# Patient Record
Sex: Male | Born: 1965 | Race: White | Hispanic: No | Marital: Married | Smoking: Never smoker
Health system: Southern US, Community
[De-identification: ages and names within clinical notes are randomized; demographics above are authoritative.]

## PROBLEM LIST (undated history)

## (undated) DIAGNOSIS — J189 Pneumonia, unspecified organism: Secondary | ICD-10-CM

## (undated) DIAGNOSIS — H8109 Meniere's disease, unspecified ear: Secondary | ICD-10-CM

## (undated) DIAGNOSIS — I10 Essential (primary) hypertension: Secondary | ICD-10-CM

## (undated) DIAGNOSIS — F419 Anxiety disorder, unspecified: Secondary | ICD-10-CM

## (undated) DIAGNOSIS — J45909 Unspecified asthma, uncomplicated: Secondary | ICD-10-CM

## (undated) DIAGNOSIS — H9190 Unspecified hearing loss, unspecified ear: Secondary | ICD-10-CM

## (undated) DIAGNOSIS — K5792 Diverticulitis of intestine, part unspecified, without perforation or abscess without bleeding: Secondary | ICD-10-CM

## (undated) HISTORY — PX: HERNIA REPAIR: SHX51

## (undated) HISTORY — PX: APPENDECTOMY: SHX54

---
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
## 1998-12-26 HISTORY — PX: COLECTOMY: SHX59

## 2012-10-04 ENCOUNTER — Ambulatory Visit (HOSPITAL_BASED_OUTPATIENT_CLINIC_OR_DEPARTMENT_OTHER)
Admission: RE | Admit: 2012-10-04 | Discharge: 2012-10-04 | Disposition: A | Discharge status: Home / Self Care | Payer: BC Managed Care – PPO | Source: Ambulatory Visit | Attending: Nurse Practitioner | Admitting: Nurse Practitioner

## 2012-10-04 ENCOUNTER — Other Ambulatory Visit (HOSPITAL_BASED_OUTPATIENT_CLINIC_OR_DEPARTMENT_OTHER): Payer: Self-pay | Admitting: Nurse Practitioner

## 2012-10-04 ENCOUNTER — Encounter (HOSPITAL_BASED_OUTPATIENT_CLINIC_OR_DEPARTMENT_OTHER): Payer: Self-pay

## 2012-10-04 DIAGNOSIS — K5730 Diverticulosis of large intestine without perforation or abscess without bleeding: Secondary | ICD-10-CM | POA: Insufficient documentation

## 2012-10-04 DIAGNOSIS — R109 Unspecified abdominal pain: Secondary | ICD-10-CM | POA: Insufficient documentation

## 2012-10-04 DIAGNOSIS — R911 Solitary pulmonary nodule: Secondary | ICD-10-CM | POA: Insufficient documentation

## 2012-10-04 MED ORDER — IOHEXOL 300 MG/ML  SOLN
100.0000 mL | Freq: Once | INTRAMUSCULAR | Status: AC | PRN
  Administered 2012-10-04: 100 mL via INTRAVENOUS

## 2019-02-23 ENCOUNTER — Inpatient Hospital Stay (HOSPITAL_COMMUNITY)
Admission: EM | Admit: 2019-02-23 | Discharge: 2019-02-28 | DRG: 329 | Disposition: A | Discharge status: Home Health | Payer: BC Managed Care – PPO | Attending: Internal Medicine | Admitting: Internal Medicine

## 2019-02-23 ENCOUNTER — Inpatient Hospital Stay (HOSPITAL_COMMUNITY): Payer: BC Managed Care – PPO

## 2019-02-23 ENCOUNTER — Emergency Department (HOSPITAL_COMMUNITY): Payer: BC Managed Care – PPO

## 2019-02-23 ENCOUNTER — Other Ambulatory Visit: Payer: Self-pay

## 2019-02-23 ENCOUNTER — Encounter (HOSPITAL_COMMUNITY): Payer: Self-pay | Admitting: Emergency Medicine

## 2019-02-23 DIAGNOSIS — K56699 Other intestinal obstruction unspecified as to partial versus complete obstruction: Secondary | ICD-10-CM | POA: Diagnosis present

## 2019-02-23 DIAGNOSIS — I10 Essential (primary) hypertension: Secondary | ICD-10-CM | POA: Diagnosis present

## 2019-02-23 DIAGNOSIS — K5732 Diverticulitis of large intestine without perforation or abscess without bleeding: Secondary | ICD-10-CM | POA: Diagnosis present

## 2019-02-23 DIAGNOSIS — J9601 Acute respiratory failure with hypoxia: Secondary | ICD-10-CM | POA: Diagnosis not present

## 2019-02-23 DIAGNOSIS — D72829 Elevated white blood cell count, unspecified: Secondary | ICD-10-CM | POA: Diagnosis not present

## 2019-02-23 DIAGNOSIS — Z9049 Acquired absence of other specified parts of digestive tract: Secondary | ICD-10-CM

## 2019-02-23 DIAGNOSIS — B9729 Other coronavirus as the cause of diseases classified elsewhere: Secondary | ICD-10-CM | POA: Diagnosis not present

## 2019-02-23 DIAGNOSIS — E8770 Fluid overload, unspecified: Secondary | ICD-10-CM | POA: Diagnosis present

## 2019-02-23 DIAGNOSIS — R066 Hiccough: Secondary | ICD-10-CM | POA: Diagnosis present

## 2019-02-23 DIAGNOSIS — E860 Dehydration: Secondary | ICD-10-CM | POA: Diagnosis present

## 2019-02-23 DIAGNOSIS — Z23 Encounter for immunization: Secondary | ICD-10-CM | POA: Diagnosis not present

## 2019-02-23 DIAGNOSIS — J189 Pneumonia, unspecified organism: Secondary | ICD-10-CM | POA: Diagnosis not present

## 2019-02-23 DIAGNOSIS — Z79899 Other long term (current) drug therapy: Secondary | ICD-10-CM

## 2019-02-23 DIAGNOSIS — J45909 Unspecified asthma, uncomplicated: Secondary | ICD-10-CM | POA: Diagnosis present

## 2019-02-23 DIAGNOSIS — H8101 Meniere's disease, right ear: Secondary | ICD-10-CM | POA: Diagnosis present

## 2019-02-23 DIAGNOSIS — Z87892 Personal history of anaphylaxis: Secondary | ICD-10-CM | POA: Diagnosis not present

## 2019-02-23 DIAGNOSIS — Z91013 Allergy to seafood: Secondary | ICD-10-CM

## 2019-02-23 DIAGNOSIS — Z8719 Personal history of other diseases of the digestive system: Secondary | ICD-10-CM | POA: Diagnosis not present

## 2019-02-23 DIAGNOSIS — K5730 Diverticulosis of large intestine without perforation or abscess without bleeding: Secondary | ICD-10-CM | POA: Diagnosis present

## 2019-02-23 DIAGNOSIS — E440 Moderate protein-calorie malnutrition: Secondary | ICD-10-CM | POA: Diagnosis present

## 2019-02-23 DIAGNOSIS — R609 Edema, unspecified: Secondary | ICD-10-CM

## 2019-02-23 DIAGNOSIS — R739 Hyperglycemia, unspecified: Secondary | ICD-10-CM | POA: Diagnosis present

## 2019-02-23 DIAGNOSIS — Z4659 Encounter for fitting and adjustment of other gastrointestinal appliance and device: Secondary | ICD-10-CM

## 2019-02-23 DIAGNOSIS — N5089 Other specified disorders of the male genital organs: Secondary | ICD-10-CM | POA: Diagnosis not present

## 2019-02-23 DIAGNOSIS — Y95 Nosocomial condition: Secondary | ICD-10-CM | POA: Diagnosis not present

## 2019-02-23 DIAGNOSIS — E739 Lactose intolerance, unspecified: Secondary | ICD-10-CM | POA: Diagnosis present

## 2019-02-23 DIAGNOSIS — F419 Anxiety disorder, unspecified: Secondary | ICD-10-CM | POA: Diagnosis present

## 2019-02-23 DIAGNOSIS — K56609 Unspecified intestinal obstruction, unspecified as to partial versus complete obstruction: Secondary | ICD-10-CM | POA: Diagnosis present

## 2019-02-23 HISTORY — DX: Anxiety disorder, unspecified: F41.9

## 2019-02-23 HISTORY — DX: Meniere's disease, unspecified ear: H81.09

## 2019-02-23 HISTORY — DX: Diverticulitis of intestine, part unspecified, without perforation or abscess without bleeding: K57.92

## 2019-02-23 LAB — CBC
HCT: 49.7 % (ref 39.0–52.0)
Hemoglobin: 16.7 g/dL (ref 13.0–17.0)
MCH: 29.8 pg (ref 26.0–34.0)
MCHC: 33.6 g/dL (ref 30.0–36.0)
MCV: 88.6 fL (ref 80.0–100.0)
PLATELETS: 302 10*3/uL (ref 150–400)
RBC: 5.61 MIL/uL (ref 4.22–5.81)
RDW: 12.4 % (ref 11.5–15.5)
WBC: 16.8 10*3/uL — ABNORMAL HIGH (ref 4.0–10.5)
nRBC: 0 % (ref 0.0–0.2)

## 2019-02-23 LAB — URINALYSIS, ROUTINE W REFLEX MICROSCOPIC
Bilirubin Urine: NEGATIVE
Glucose, UA: NEGATIVE mg/dL
Hgb urine dipstick: NEGATIVE
Ketones, ur: 80 mg/dL — AB
Nitrite: NEGATIVE
PH: 5 (ref 5.0–8.0)
Protein, ur: 30 mg/dL — AB
Specific Gravity, Urine: 1.024 (ref 1.005–1.030)

## 2019-02-23 LAB — COMPREHENSIVE METABOLIC PANEL
ALT: 18 U/L (ref 0–44)
AST: 21 U/L (ref 15–41)
Albumin: 4.3 g/dL (ref 3.5–5.0)
Alkaline Phosphatase: 61 U/L (ref 38–126)
Anion gap: 12 (ref 5–15)
BUN: 10 mg/dL (ref 6–20)
CHLORIDE: 107 mmol/L (ref 98–111)
CO2: 18 mmol/L — ABNORMAL LOW (ref 22–32)
Calcium: 9.2 mg/dL (ref 8.9–10.3)
Creatinine, Ser: 0.91 mg/dL (ref 0.61–1.24)
GFR calc Af Amer: >60 mL/min (ref >60)
GFR calc non Af Amer: >60 mL/min (ref >60)
Glucose, Bld: 154 mg/dL — ABNORMAL HIGH (ref 70–99)
Potassium: 3.1 mmol/L — ABNORMAL LOW (ref 3.5–5.1)
Sodium: 137 mmol/L (ref 135–145)
Total Bilirubin: 1.3 mg/dL — ABNORMAL HIGH (ref 0.3–1.2)
Total Protein: 7.2 g/dL (ref 6.5–8.1)

## 2019-02-23 LAB — LIPASE, BLOOD: Lipase: 34 U/L (ref 11–51)

## 2019-02-23 LAB — MAGNESIUM: Magnesium: 2.3 mg/dL (ref 1.7–2.4)

## 2019-02-23 MED ORDER — FAMOTIDINE IN NACL 20-0.9 MG/50ML-% IV SOLN
20.0000 mg | INTRAVENOUS | Status: AC
  Administered 2019-02-23: 20 mg via INTRAVENOUS
  Filled 2019-02-23: qty 50

## 2019-02-23 MED ORDER — LORAZEPAM 2 MG/ML IJ SOLN: 1.0000 mg | Freq: Once | INTRAMUSCULAR | Status: DC

## 2019-02-23 MED ORDER — SODIUM CHLORIDE 0.9 % IV SOLN
INTRAVENOUS | Status: DC | PRN
  Administered 2019-02-23: 250 mL via INTRAVENOUS

## 2019-02-23 MED ORDER — ACETAMINOPHEN 650 MG RE SUPP: 650.0000 mg | Freq: Four times a day (QID) | RECTAL | Status: DC | PRN

## 2019-02-23 MED ORDER — SODIUM CHLORIDE 0.9% FLUSH
3.0000 mL | Freq: Once | INTRAVENOUS | Status: AC
  Administered 2019-02-23: 3 mL via INTRAVENOUS

## 2019-02-23 MED ORDER — ONDANSETRON HCL 4 MG PO TABS: 4.0000 mg | ORAL_TABLET | Freq: Four times a day (QID) | ORAL | Status: DC | PRN

## 2019-02-23 MED ORDER — POTASSIUM CHLORIDE IN NACL 40-0.9 MEQ/L-% IV SOLN
INTRAVENOUS | Status: DC
  Administered 2019-02-23 – 2019-02-25 (×6): 125 mL/h via INTRAVENOUS
  Filled 2019-02-23 (×7): qty 1000

## 2019-02-23 MED ORDER — PROMETHAZINE HCL 25 MG/ML IJ SOLN: 12.5000 mg | Freq: Four times a day (QID) | INTRAMUSCULAR | Status: DC | PRN

## 2019-02-23 MED ORDER — PIPERACILLIN-TAZOBACTAM 3.375 G IVPB
3.3750 g | Freq: Three times a day (TID) | INTRAVENOUS | Status: DC
  Administered 2019-02-23 – 2019-02-28 (×16): 3.375 g via INTRAVENOUS
  Filled 2019-02-23 (×16): qty 50

## 2019-02-23 MED ORDER — HYDROMORPHONE HCL 1 MG/ML IJ SOLN
0.5000 mg | Freq: Once | INTRAMUSCULAR | Status: AC | PRN
  Administered 2019-02-23: 0.5 mg via INTRAVENOUS
  Filled 2019-02-23: qty 1

## 2019-02-23 MED ORDER — ACETAMINOPHEN 325 MG PO TABS: 650.0000 mg | ORAL_TABLET | Freq: Four times a day (QID) | ORAL | Status: DC | PRN

## 2019-02-23 MED ORDER — ONDANSETRON HCL 4 MG/2ML IJ SOLN
4.0000 mg | Freq: Once | INTRAMUSCULAR | Status: DC
  Filled 2019-02-23: qty 2

## 2019-02-23 MED ORDER — SODIUM CHLORIDE 0.9 % IV SOLN
25.0000 mg | Freq: Four times a day (QID) | INTRAVENOUS | Status: DC | PRN
  Administered 2019-02-23 – 2019-02-24 (×2): 25 mg via INTRAVENOUS
  Filled 2019-02-23 (×3): qty 1

## 2019-02-23 MED ORDER — ONDANSETRON HCL 4 MG/2ML IJ SOLN
4.0000 mg | Freq: Four times a day (QID) | INTRAMUSCULAR | Status: DC | PRN
  Administered 2019-02-23: 4 mg via INTRAVENOUS
  Filled 2019-02-23: qty 2

## 2019-02-23 MED ORDER — SODIUM CHLORIDE 0.9 % IV BOLUS
1000.0000 mL | Freq: Once | INTRAVENOUS | Status: AC
  Administered 2019-02-23: 1000 mL via INTRAVENOUS

## 2019-02-23 MED ORDER — LORAZEPAM 2 MG/ML IJ SOLN
1.0000 mg | INTRAMUSCULAR | Status: DC | PRN
  Administered 2019-02-23 – 2019-02-28 (×8): 1 mg via INTRAVENOUS
  Filled 2019-02-23 (×8): qty 1

## 2019-02-23 MED ORDER — HYDROMORPHONE HCL 1 MG/ML IJ SOLN
1.0000 mg | Freq: Once | INTRAMUSCULAR | Status: AC
  Administered 2019-02-23: 1 mg via INTRAVENOUS
  Filled 2019-02-23: qty 1

## 2019-02-23 MED ORDER — METOCLOPRAMIDE HCL 5 MG/ML IJ SOLN: 10.0000 mg | INTRAMUSCULAR | Status: DC

## 2019-02-23 MED ORDER — ONDANSETRON 4 MG PO TBDP
4.0000 mg | ORAL_TABLET | Freq: Once | ORAL | Status: AC | PRN
  Administered 2019-02-23: 4 mg via ORAL
  Filled 2019-02-23: qty 1

## 2019-02-23 MED ORDER — LORAZEPAM 2 MG/ML IJ SOLN
0.5000 mg | Freq: Once | INTRAMUSCULAR | Status: AC
  Administered 2019-02-23: 0.5 mg via INTRAVENOUS
  Filled 2019-02-23: qty 1

## 2019-02-23 MED ORDER — HYDROMORPHONE HCL 1 MG/ML IJ SOLN
1.0000 mg | INTRAMUSCULAR | Status: DC | PRN
  Administered 2019-02-23 – 2019-02-27 (×17): 1 mg via INTRAVENOUS
  Filled 2019-02-23 (×17): qty 1

## 2019-02-23 MED ORDER — MORPHINE SULFATE (PF) 2 MG/ML IV SOLN
2.0000 mg | INTRAVENOUS | Status: DC | PRN
  Administered 2019-02-23 (×4): 2 mg via INTRAVENOUS
  Filled 2019-02-23 (×4): qty 1

## 2019-02-23 MED ORDER — WHITE PETROLATUM EX OINT
TOPICAL_OINTMENT | CUTANEOUS | Status: AC
  Filled 2019-02-23: qty 28.35

## 2019-02-23 MED ORDER — IOHEXOL 300 MG/ML  SOLN
100.0000 mL | Freq: Once | INTRAMUSCULAR | Status: AC | PRN
  Administered 2019-02-23: 100 mL via INTRAVENOUS

## 2019-02-23 NOTE — ED Notes (Signed)
Paged Dr.Mann-hung's Office Per PA [employer].

## 2019-02-23 NOTE — ED Notes (Signed)
Pt having abd pain with nausea and vomiting for 23 days.  No diarrhea burping and retching

## 2019-02-23 NOTE — ED Provider Notes (Signed)
[facility] EMERGENCY DEPARTMENT Provider Note   CSN: [id number] Arrival date & time: 02/23/19  7001    History   Chief Complaint Chief Complaint  Patient presents with  . Abdominal Pain    HPI Christopher Olson is a 53 y.o. male.    53 year old male presents to the emergency department for evaluation of abdominal pain.  Reports that he has been having intermittent abdominal cramping over the past week.  Symptoms have been worse over the past 12 hours.  Reports that abdominal pain has been periumbilical.  He has had associated nausea with innumerable dry heaves.  No specific emesis or any hematemesis.  He continues to have a bowel movement daily, but has not had a bowel movement since acute worsening of his pain.  No fevers, dysuria, hematuria, melena, hematochezia.  Abdominal surgical history significant for appendectomy.  Is followed by Dr. Loreta Ave of gastroenterology.  The history is provided by the patient. No language interpreter was used.  Abdominal Pain    Past Medical History:  Diagnosis Date  . Diverticulitis     There are no active problems to display for this patient.   History reviewed. No pertinent surgical history.      Home Medications    Prior to Admission medications   Not on File    Family History No family history on file.  Social History Social History   Tobacco Use  . Smoking status: Never Smoker  . Smokeless tobacco: Never Used  Substance Use Topics  . Alcohol use: Yes  . Drug use: Never     Allergies   Patient has no known allergies.   Review of Systems Review of Systems  Gastrointestinal: Positive for abdominal pain.  Ten systems reviewed and are negative for acute change, except as noted in the HPI.    Physical Exam Updated Vital Signs BP (!) 130/96   Pulse 87   Temp 98.7 F (37.1 C) (Oral)   Resp 18   Ht 5\' 6"  (1.676 m)   Wt 92.5 kg   SpO2 93%   BMI 32.93 kg/m   Physical Exam Vitals signs and  nursing note reviewed.  Constitutional:      General: He is not in acute distress.    Appearance: He is well-developed. He is not diaphoretic.     Comments: Nontoxic appearing and in NAD  HENT:     Head: Normocephalic and atraumatic.  Eyes:     General: No scleral icterus.    Conjunctiva/sclera: Conjunctivae normal.  Neck:     Musculoskeletal: Normal range of motion.  Cardiovascular:     Rate and Rhythm: Normal rate and regular rhythm.     Pulses: Normal pulses.  Pulmonary:     Effort: Pulmonary effort is normal. No respiratory distress.     Breath sounds: No stridor. No wheezing.     Comments: Respirations even and unlabored Abdominal:     Palpations: There is no mass.     Tenderness: There is abdominal tenderness.     Hernia: No hernia is present.     Comments: Soft, distended abdomen with TTP mostly in the RUQ and epigastrium. Hypoactive bowel sounds. No peritoneal signs.  Musculoskeletal: Normal range of motion.  Skin:    General: Skin is warm and dry.     Coloration: Skin is not pale.     Findings: No erythema or rash.  Neurological:     Mental Status: He is alert and oriented to person, place, and time.  Psychiatric:        Behavior: Behavior normal.      ED Treatments / Results  Labs (all labs ordered are listed, but only abnormal results are displayed) Labs Reviewed  COMPREHENSIVE METABOLIC PANEL - Abnormal; Notable for the following components:      Result Value   Potassium 3.1 (*)    CO2 18 (*)    Glucose, Bld 154 (*)    Total Bilirubin 1.3 (*)    All other components within normal limits  CBC - Abnormal; Notable for the following components:   WBC 16.8 (*)    All other components within normal limits  URINALYSIS, ROUTINE W REFLEX MICROSCOPIC - Abnormal; Notable for the following components:   Color, Urine AMBER (*)    APPearance HAZY (*)    Ketones, ur 80 (*)    Protein, ur 30 (*)    Leukocytes,Ua SMALL (*)    Bacteria, UA FEW (*)    All other  components within normal limits  LIPASE, BLOOD    EKG None  Radiology Ct Abdomen Pelvis W Contrast  Result Date: 02/23/2019 CLINICAL DATA:  53 year old male with generalized abdominal pain and nausea. History of sigmoid diverticulitis. EXAM: CT ABDOMEN AND PELVIS WITH CONTRAST TECHNIQUE: Multidetector CT imaging of the abdomen and pelvis was performed using the standard protocol following bolus administration of intravenous contrast. CONTRAST:  OMNIPAQUE IOHEXOL 300 MG/ML  SOLN COMPARISON:  CT of the abdomen pelvis dated 10/04/2012 FINDINGS: Lower chest: The visualized lung bases are clear. No intra-abdominal free air or free fluid. Hepatobiliary: The liver is unremarkable. No intrahepatic biliary ductal dilatation. The gallbladder is unremarkable. Pancreas: Unremarkable. No pancreatic ductal dilatation or surrounding inflammatory changes. Spleen: Normal in size without focal abnormality. Adrenals/Urinary Tract: Adrenal glands are unremarkable. Kidneys are normal, without renal calculi, focal lesion, or hydronephrosis. Bladder is unremarkable. Stomach/Bowel: There is sigmoid diverticulosis. There is a 6 cm segmental area of thickening of the sigmoid colon (series 3 image 65 and coronal series 6, image 87) most likely muscular hypertrophy and with luminal stricture secondary to prior diverticulitis. Underlying mass is favored less likely but not entirely excluded. Follow-up with colonoscopy is recommended. There is associated luminal narrowing and high-grade obstruction with distension of stool filled colon proximal to this segment. The distended colon measures approximately 9 cm in diameter. A second area of stricture in the sigmoid colon is noted more distally. There is no evidence of small-bowel obstruction. The appendix is not visualized with certainty. No inflammatory changes identified in the right lower quadrant. Vascular/Lymphatic: The abdominal aorta and IVC are grossly unremarkable. No  portal venous gas. There is no adenopathy. Reproductive: The the prostate and seminal vesicles are grossly unremarkable. Other: None Musculoskeletal: Bilateral L5 pars defects. No listhesis. No acute osseous pathology. IMPRESSION: 1. High-grade obstruction of the sigmoid colon secondary to a segmental stricture sequela of prior diverticulitis. Underlying mass is favored less likely but not entirely excluded. Clinical correlation and follow-up with colonoscopy recommended. Diffusely dilated colon proximal to the strictures sigmoid segment measures up to 9 cm in diameter. 2. Colonic diverticulosis. Electronically Signed   By: Elgie Collard M.D.   On: 02/23/2019 06:58    Procedures Procedures (including critical care time)   Medications Ordered in ED Medications  sodium chloride flush (NS) 0.9 % injection 3 mL (has no administration in time range)  HYDROmorphone (DILAUDID) injection 0.5 mg (has no administration in time range)  ondansetron (ZOFRAN-ODT) disintegrating tablet 4 mg (4 mg Oral Given 02/23/19  0250)  HYDROmorphone (DILAUDID) injection 1 mg (1 mg Intravenous Given 02/23/19 0526)  famotidine (PEPCID) IVPB 20 mg premix (0 mg Intravenous Stopped 02/23/19 0628)  sodium chloride 0.9 % bolus 1,000 mL (1,000 mLs Intravenous New Bag/Given 02/23/19 0521)  LORazepam (ATIVAN) injection 0.5 mg (0.5 mg Intravenous Given 02/23/19 0522)  iohexol (OMNIPAQUE) 300 MG/ML solution 100 mL (100 mLs Intravenous Contrast Given 02/23/19 0603)    6:02 AM Wife reports improvement to pain prior to transport to CT.  7:08 AM [facility] paged to discuss CT findings.  7:27 AM Spoke with PA Marlyne Beards of [facility]. General surgery will evaluate in the ED. Request medicine admission, also GI consultation.  7:41 AM Case discussed with Dr. Ophelia Charter who will admit.  7:44 AM Spoke with Dr. Lavon Paganini on call for Mann/Hung.  She will review patient's case and see what recommendations she may offer.  States that there is little role for  gastroenterology as they typically do not do any interventions such as dilation of stricture for management in these cases.  CRITICAL CARE Performed by: Antony Madura   Total critical care time: 35 minutes  Critical care time was exclusive of separately billable procedures and treating other patients.  Critical care was necessary to treat or prevent imminent or life-threatening deterioration.  Critical care was time spent personally by me on the following activities: development of treatment plan with patient and/or surrogate as well as nursing, discussions with consultants, evaluation of patient's response to treatment, examination of patient, obtaining history from patient or surrogate, ordering and performing treatments and interventions, ordering and review of laboratory studies, ordering and review of radiographic studies, pulse oximetry and re-evaluation of patient's condition.   Initial Impression / Assessment and Plan / ED Course  I have reviewed the triage vital signs and the nursing notes.  Pertinent labs & imaging results that were available during my care of the patient were reviewed by me and considered in my medical decision making (see chart for details).        53 year old male presenting for abdominal pain.  CT shows colonic obstruction, suspected secondary to stricture from prior diverticulitis.  Pain has been adequately controlled with Dilaudid.  He has been given Ativan and Reglan for nausea.  Hospitalist service to admit with consultations from general surgery.  Dr. Lavon Paganini of GI to review patient case.   Final Clinical Impressions(s) / ED Diagnoses   Final diagnoses:  Colonic obstruction [facility])    ED Discharge Orders    None       Antony Madura, PA-C 02/23/19 0745    Palumbo, April, MD 02/23/19 2327

## 2019-02-23 NOTE — ED Notes (Signed)
The pts pain is coming back and so has his nausea.  Wife at the bedside

## 2019-02-23 NOTE — H&P (Addendum)
History and Physical    Christopher Olson:[id number] DOB: July 18, 1966 DOA: 02/23/2019  PCP: [name], MD Consultants:  Loreta Ave - GI; ENT Patient coming from:  Home - lives with wife and 2 children; NOK: Wife, [phone]  Chief Complaint: Abdominal pain  HPI: Christopher Olson is a 53 y.o. male with medical history significant of diverticulitis presenting with abdominal pain.  For the last week or so, he has been having "more rumbly gas that wasn't exiting.  The last time he saw Dr. Loreta Ave and got Rifaximin for a massive floral imbalance.  He was thinking that was what it was again".  He called GI and was told to take the medication (rifaximin and Augmentin) for a second round.  He took it yesterday but started with dry heaves from 2pm yesterday.  He gets episodic horrible cramping and then heaving, up to every 7 minutes.  He was also having violent hiccups.  He is overall better right now, temporarily.  No fevers.  Last BM was yesterday AM and was normal.  Pain is diffuse and kind of moves around but is periumbilical.     ED Course:  Periumbilical pain.  Imaging appears to indicate colonic stricture, possible from sigmoid diverticular-related stricture.  He has followed with Dr. Nicholes Mango for diverticulitis.  Review of Systems: As per HPI; otherwise review of systems reviewed and negative.   Ambulatory Status:  Ambulates without assistance  Past Medical History:  Diagnosis Date  . Anxiety   . Diverticulitis   . Meniere disease    with hearing loss    Past Surgical History:  Procedure Laterality Date  . APPENDECTOMY    . COLECTOMY  2000   for precancerous polyps  . HERNIA REPAIR      Social History   Socioeconomic History  . Marital status: Married    Spouse name: Not on file  . Number of children: Not on file  . Years of education: Not on file  . Highest education level: Not on file  Occupational History  . Occupation: high school Retail buyer  Social Needs  . Financial  resource strain: Not on file  . Food insecurity:    Worry: Not on file    Inability: Not on file  . Transportation needs:    Medical: Not on file    Non-medical: Not on file  Tobacco Use  . Smoking status: Never Smoker  . Smokeless tobacco: Never Used  Substance and Sexual Activity  . Alcohol use: Yes    Alcohol/week: 2.0 standard drinks    Types: 2 Shots of liquor per week  . Drug use: Never  . Sexual activity: Not on file  Lifestyle  . Physical activity:    Days per week: Not on file    Minutes per session: Not on file  . Stress: Not on file  Relationships  . Social connections:    Talks on phone: Not on file    Gets together: Not on file    Attends religious service: Not on file    Active member of club or organization: Not on file    Attends meetings of clubs or organizations: Not on file    Relationship status: Not on file  . Intimate partner violence:    Fear of current or ex partner: Not on file    Emotionally abused: Not on file    Physically abused: Not on file    Forced sexual activity: Not on file  Other Topics Concern  . Not  on file  Social History Narrative  . Not on file    Allergies  Allergen Reactions  . Shellfish-Derived Products Anaphylaxis  . Lactose Intolerance (Gi) Diarrhea and Other (See Comments)    Bloating, also    Family History  Problem Relation Age of Onset  . Irritable bowel syndrome Mother     Prior to Admission medications   Not on File    Physical Exam: Vitals:   02/23/19 0530 02/23/19 0700 02/23/19 0715 02/23/19 0800  BP: (!) 130/96 (!) 163/87  (!) 165/92  Pulse: 87 82 76 83  Resp:      Temp:      TempSrc:      SpO2: 93% 95% 95% 97%  Weight:      Height:         . General:  Appears calm but uncomfortable and is NAD . Eyes:  PERRL, EOMI, normal lids, iris . ENT:  grossly normal hearing, lips & tongue, mmm; appropriate dentition . Neck:  no LAD, masses or thyromegaly . Cardiovascular:  RRR, no m/r/g. No LE edema.   Marland Kitchen Respiratory:   CTA bilaterally with no wheezes/rales/rhonchi.  Normal respiratory effort. . Abdomen:  soft, NT, ND, hypoactive BS . Skin:  no rash or induration seen on limited exam . Musculoskeletal:  grossly normal tone BUE/BLE, good ROM, no bony abnormality . Psychiatric:  grossly normal mood and affect, speech fluent and appropriate, AOx3 . Neurologic:  CN 2-12 grossly intact, moves all extremities in coordinated fashion, sensation intact    Radiological Exams on Admission: Ct Abdomen Pelvis W Contrast  Result Date: 02/23/2019 CLINICAL DATA:  53 year old male with generalized abdominal pain and nausea. History of sigmoid diverticulitis. EXAM: CT ABDOMEN AND PELVIS WITH CONTRAST TECHNIQUE: Multidetector CT imaging of the abdomen and pelvis was performed using the standard protocol following bolus administration of intravenous contrast. CONTRAST:  OMNIPAQUE IOHEXOL 300 MG/ML  SOLN COMPARISON:  CT of the abdomen pelvis dated 10/04/2012 FINDINGS: Lower chest: The visualized lung bases are clear. No intra-abdominal free air or free fluid. Hepatobiliary: The liver is unremarkable. No intrahepatic biliary ductal dilatation. The gallbladder is unremarkable. Pancreas: Unremarkable. No pancreatic ductal dilatation or surrounding inflammatory changes. Spleen: Normal in size without focal abnormality. Adrenals/Urinary Tract: Adrenal glands are unremarkable. Kidneys are normal, without renal calculi, focal lesion, or hydronephrosis. Bladder is unremarkable. Stomach/Bowel: There is sigmoid diverticulosis. There is a 6 cm segmental area of thickening of the sigmoid colon (series 3 image 65 and coronal series 6, image 87) most likely muscular hypertrophy and with luminal stricture secondary to prior diverticulitis. Underlying mass is favored less likely but not entirely excluded. Follow-up with colonoscopy is recommended. There is associated luminal narrowing and high-grade obstruction with distension of  stool filled colon proximal to this segment. The distended colon measures approximately 9 cm in diameter. A second area of stricture in the sigmoid colon is noted more distally. There is no evidence of small-bowel obstruction. The appendix is not visualized with certainty. No inflammatory changes identified in the right lower quadrant. Vascular/Lymphatic: The abdominal aorta and IVC are grossly unremarkable. No portal venous gas. There is no adenopathy. Reproductive: The the prostate and seminal vesicles are grossly unremarkable. Other: None Musculoskeletal: Bilateral L5 pars defects. No listhesis. No acute osseous pathology. IMPRESSION: 1. High-grade obstruction of the sigmoid colon secondary to a segmental stricture sequela of prior diverticulitis. Underlying mass is favored less likely but not entirely excluded. Clinical correlation and follow-up with colonoscopy recommended. Diffusely dilated colon proximal  to the strictures sigmoid segment measures up to 9 cm in diameter. 2. Colonic diverticulosis. Electronically Signed   By: Elgie Collard M.D.   On: 02/23/2019 06:58    EKG: not done   Labs on Admission: I have personally reviewed the available labs and imaging studies at the time of the admission.  Pertinent labs:   CO2 18 Glucose 154 WBC 16.8 UA: ketones 80, 30 protein, small LE  Assessment/Plan Principal Problem:   Colonic obstruction (HCC) Active Problems:   Hyperglycemia   Colonic obstruction -Patient with severe episodic abdominal pain with retching and hiccups  -Imaging shows high-grade colonic obstruction, likely related to sigmoid stricture in the setting of diverticular disease -Patient is quite symptomatic and likely needs intervention as soon as reasonable -GI has been consulted and Dr. Loreta Ave will see the patient for consideration of colonoscopy and possible stent placement -Additionally, surgery has been consulted.  PA Marlyne Beards has seen the patient and I spoke with Dr.  Janee Morn about the patient, as the need for surgical intervention appears to be more likely at this time -Will admit to Med Surg -IV Zosyn for diverticuliltis empirically, although active infection is a lesser consideration at this time -UA indicates ketonuria c/w dehydration so continue IVF at 125 cc/hr while patient is NPO -Mass could not be excluded by imaging and so CEA has been ordered, but this appears less likely  Hyperglycemia -May be stress response -Will follow with fasting AM labs -It is unlikely that he will need acute or chronic treatment for this issue at this time   DVT prophylaxis:  SCDs given probable need for intervention Code Status:  Full - confirmed with patient/family Family Communication: Wife [facility]) present throughout evaluation  Disposition Plan:  Home once clinically improved Consults called: GI; Surgery  Admission status: Admit - It is my clinical opinion that admission to INPATIENT is reasonable and necessary because of the expectation that this patient will require hospital care that crosses at least 2 midnights to treat this condition based on the medical complexity of the problems presented.  Given the aforementioned information, the predictability of an adverse outcome is felt to be significant.    [name] MD [facility]   How to contact the [facility] Attending or Consulting provider 7A - 7P or covering provider during after hours 7P -7A, for this patient?  1. Check the care team in [facility] and look for a) attending/consulting [facility] provider listed and b) the [facility] team listed 2. Log into [email or web address] and use [place]'s universal password to access. If you do not have the password, please contact the hospital operator. 3. Locate the [facility] provider you are looking for under [facility] and page to a number that you can be directly reached. 4. If you still have difficulty reaching the provider, please page the [facility] (Director on Call)  for the Hospitalists listed on amion for assistance.   02/23/2019, 9:00 AM

## 2019-02-23 NOTE — Consult Note (Addendum)
Referring Provider: [facility]    Primary Care Physician:  Christopher Mylar, MD Primary Gastroenterologist: Christopher Brooklyn, MD    Reason for Consultation:  Bowel obstruction    ASSESSMENT / PLAN:     53 yo male with hx of diverticulitis, last episode ~2013. Admitted now with high grade obstruction due to sigmoid colon stricture . Mass not excluded but not favored. Stricture possibly sequelae of previous episodes of diverticulosis. Also, patient gives a remote hx of bowel "surgery" for colon polyps though I doubt this is an anastomotic stricture.  -CT scan doesn't suggest acute diverticulitis but probably make sense to continue antibiotics just in case .  -Bowel rest -No plans for colonoscopy at this point. If he opens up with bowel rest then we can try and prep for lower endoscopy to rule out mass though probably unlikely if colonoscopy in Aug 2017 was normal. Unfortunately we don't have assess to Dr. Kenna Olson records though she or her partner Dr. Elnoria Olson will resume patient's care on Monday.  -Patient heaving, will order NGT to LIS   HPI:     HPI: Christopher Olson is a 53 y.o. male with a hx of diverticulitis, last episode ~ 2013. He gives a history of bowel "surgery" for multiple precancer polyps when in his 30's. He has had several surveillance colonoscopies since, apparently with recurrent polyps. His last colonoscopy was in [place] in Aug 2017. I cannot find report in Care Everywhere but patient believes it was normal.   Last week [name] began having rumbling in his stomach. As days passed he developed distention and became unable to pass flatus. By Friday he was having mid and LLQ pain with dry heaves. No fevers. He hasn't had any associated constipation. CT scan revealing high grade colon obstruction.   General Surgery is following. Patient understands he may come to a colostomy at some point.     Past Medical History:  Diagnosis Date  . Anxiety   . Diverticulitis   . Meniere  disease    with hearing loss    Past Surgical History:  Procedure Laterality Date  . APPENDECTOMY    . COLECTOMY  2000   for precancerous polyps  . HERNIA REPAIR      Prior to Admission medications   Medication Sig Start Date End Date Taking? Authorizing Provider  amoxicillin-clavulanate (AUGMENTIN) 875-125 MG tablet Take 1 tablet by mouth 2 (two) times daily. FOR 7 DAYS 02/20/19 02/27/19 Yes [provider]  LORazepam (ATIVAN) 0.5 MG tablet Take 0.5 mg by mouth at bedtime as needed for anxiety or sleep.  02/02/19  Yes [provider]  rifAXIMin (XIFAXAN PO) Take 1 tablet by mouth 2 (two) times daily. 02/22/19  Yes [provider]  UNKNOWN TO PATIENT Take 1 capsule by mouth See admin instructions. Unnamed refrigerated probiotic: Take 1 capsule by once a day   Yes [provider]    Current Facility-Administered Medications  Medication Dose Route Frequency Provider Last Rate Last Dose  . 0.9 %  sodium chloride infusion   Intravenous PRN Christopher Blue, MD 10 mL/hr at 02/23/19 0935 250 mL at 02/23/19 0935  . 0.9 % NaCl with KCl 40 mEq / L  infusion   Intravenous Continuous Christopher Blue, MD 125 mL/hr at 02/23/19 1059 125 mL/hr at 02/23/19 1059  . acetaminophen (TYLENOL) tablet 650 mg  650 mg Oral Q6H PRN Christopher Blue, MD       Or  . acetaminophen (TYLENOL) suppository 650 mg  650 mg Rectal Q6H PRN Christopher Blue, MD      . chlorproMAZINE (THORAZINE) 25 mg in sodium chloride 0.9 % 25 mL IVPB  25 mg Intravenous Q6H PRN Christopher Gelinas, MD      . LORazepam (ATIVAN) injection 1 mg  1 mg Intravenous Q4H PRN Christopher Blue, MD   1 mg at 02/23/19 1311  . morphine 2 MG/ML injection 2 mg  2 mg Intravenous Q2H PRN Christopher Blue, MD   2 mg at 02/23/19 1312  . ondansetron (ZOFRAN) tablet 4 mg  4 mg Oral Q6H PRN Christopher Blue, MD       Or  . ondansetron [facility]) injection 4 mg  4 mg Intravenous Q6H PRN Christopher Blue, MD   4 mg at 02/23/19 3794  .  piperacillin-tazobactam (ZOSYN) IVPB 3.375 g  3.375 g Intravenous Christopher Mo, MD 12.5 mL/hr at 02/23/19 1312 3.375 g at 02/23/19 1312  . promethazine (PHENERGAN) injection 12.5-25 mg  12.5-25 mg Intravenous Q6H PRN Christopher Blue, MD      . white petrolatum (VASELINE) gel             Allergies as of 02/23/2019 - Review Complete 02/23/2019  Allergen Reaction Noted  . Shellfish-derived products Anaphylaxis 02/23/2019  . Lactose intolerance (gi) Diarrhea and Other (See Comments) 02/23/2019    Family History  Problem Relation Age of Onset  . Irritable bowel syndrome Mother     Social History   Socioeconomic History  . Marital status: Married    Spouse name: Not on file  . Number of children: Not on file  . Years of education: Not on file  . Highest education level: Not on file  Occupational History  . Occupation: high school Retail buyer  Social Needs  . Financial resource strain: Not on file  . Food insecurity:    Worry: Not on file    Inability: Not on file  . Transportation needs:    Medical: Not on file    Non-medical: Not on file  Tobacco Use  . Smoking status: Never Smoker  . Smokeless tobacco: Never Used  Substance and Sexual Activity  . Alcohol use: Yes    Alcohol/week: 2.0 standard drinks    Types: 2 Shots of liquor per week  . Drug use: Never  . Sexual activity: Not on file  Lifestyle  . Physical activity:    Days per week: Not on file    Minutes per session: Not on file  . Stress: Not on file  Relationships  . Social connections:    Talks on phone: Not on file    Gets together: Not on file    Attends religious service: Not on file    Active member of club or organization: Not on file    Attends meetings of clubs or organizations: Not on file    Relationship status: Not on file  . Intimate partner violence:    Fear of current or ex partner: Not on file    Emotionally abused: Not on file    Physically abused: Not on file    Forced sexual  activity: Not on file  Other Topics Concern  . Not on file  Social History Narrative  . Not on file    Review of Systems: All systems reviewed and negative except where noted in HPI.  Physical Exam: Vital signs in last 24 hours: Temp:  [98.1 F (36.7 C)-98.7 F (37.1 C)] 98.1 F (36.7 C) (02/29 0913) Pulse Rate:  [63-100] 63 (  02/29 0913) Resp:  [17-18] 18 (02/29 0357) BP: (121-173)/(76-104) 121/76 (02/29 1300) SpO2:  [93 %-100 %] 100 % (02/29 0913) Weight:  [92.5 kg] 92.5 kg (02/29 0242) Last BM Date: 02/22/19 General:   Alert, well-developed, male in NAD. Constant hiccoughs Psych:  Pleasant, cooperative. Normal mood and affect. Eyes:  Pupils equal, sclera clear, no icterus.   Conjunctiva pink. Ears:  Normal auditory acuity. Nose:  No deformity, discharge,  or lesions. Neck:  Supple; no masses Lungs:  Clear throughout to auscultation.   No wheezes, crackles, or rhonchi.  Heart:  Regular rate and rhythm; no murmurs, no lower extremity edema Abdomen:  Soft, moderately distended with tympany, hypoactive bowel sounds at present. Not particularly tender.   Rectal:  Deferred  Msk:  Symmetrical without gross deformities. . Neurologic:  Alert and  oriented x4;  grossly normal neurologically. Skin:  Intact without significant lesions or rashes.   Intake/Output from previous day: 02/28 0701 - 02/29 0700 In: 2000 [I.V.:1000; IV Piggyback:1000] Out: -  Intake/Output this shift: Total I/O In: -  Out: 250 [Urine:250]  Lab Results: Recent Labs    02/23/19 0253  WBC 16.8*  HGB 16.7  HCT 49.7  PLT 302   BMET Recent Labs    02/23/19 0253  NA 137  K 3.1*  CL 107  CO2 18*  GLUCOSE 154*  BUN 10  CREATININE 0.91  CALCIUM 9.2   LFT Recent Labs    02/23/19 0253  PROT 7.2  ALBUMIN 4.3  AST 21  ALT 18  ALKPHOS 61  BILITOT 1.3*    Studies/Results: Ct Abdomen Pelvis W Contrast  Result Date: 02/23/2019 CLINICAL DATA:  52 year old male with generalized abdominal  pain and [id number].Nadara [id number].Na Misty StanlCharm [place]276 [place] Lower chest: The visualized lung bases are clear. No intra-abdominal free air or free fluid. Hepatobiliary: The liver is unremarkable. No intrahepatic biliary ductal dilatation. The gallbladder is unremarkable. Pancreas: Unremarkable. No pancreatic ductal dilatation or surrounding inflammatory changes. Spleen: Normal in size without focal abnormality. Adrenals/Urinary Tract: Adrenal glands are unremarkable. Kidneys are normal, without renal calculi, focal lesion, or hydronephrosis. Bladder is unremarkable. Stomach/Bowel: There is sigmoid diverticulosis. There is a 6 cm segmental area of thickening of the sigmoid colon (series 3 image 65 and coronal series 6, image 87) most likely muscular hypertrophy and with luminal stricture secondary to prior diverticulitis. Underlying mass is favored less likely but not entirely excluded. Follow-up with colonoscopy is recommended. There is associated luminal narrowing and high-grade obstruction with distension of stool filled colon proximal to this segment. The distended colon measures approximately 9 cm in diameter. A second area of stricture in the sigmoid colon is noted more distally. There is no evidence of small-bowel obstruction. The appendix is not visualized with certainty. No inflammatory changes identified in the right lower quadrant. Vascular/Lymphatic: The abdominal aorta and IVC are grossly unremarkable. No portal venous gas. There is no adenopathy. Reproductive: The the prostate and seminal vesicles are grossly unremarkable. Other: None Musculoskeletal: Bilateral L5 pars defects. No listhesis. No acute  osseous pathology. IMPRESSION: 1. High-grade obstruction of the sigmoid colon secondary to a segmental stricture sequela of prior diverticulitis. Underlying mass is favored less likely but not entirely excluded. Clinical correlation and follow-up  with colonoscopy recommended. Diffusely dilated colon proximal to the strictures sigmoid segment measures up to 9 cm in diameter. 2. Colonic diverticulosis. Electronically Signed   By: Elgie Collard M.D.   On: 02/23/2019 06:58     Willette Cluster, NP-C @  02/23/2019, 3:03 PM   Attending physician's note   I have taken a history, examined the patient and reviewed the chart. I agree with the Advanced Practitioner's note, impression and recommendations.  53 year old male with a history of adenomatous colon polyps status post segmental colon resection and diverticulitis admitted with colonic obstruction /obstipation.  Based on CT no inflammation or acute diverticulitis and no definitive mass lesion.  High-grade stricture could be secondary to anastomotic stenosis from prior surgery or recurrent diverticulitis or adhesions  He is not passing any flatus Abdomen significantly distended, firm and tympanic He is having nausea, vomiting and abdominal discomfort, will benefit from NG tube placement for decompression  No history of IBD. He is up-to-date with colorectal cancer screening, last colonoscopy August 2017 report is not available, according to patient was recommended recall in 10 years. Do not have access to [facility] electronic medical record  Available if needed, but GI role is limited in this situation.  Cannot do bowel prep or colonoscopy, at risk for colon perforation. Management of bowel obstruction as per surgery team. Please call with any questions   K. Scherry Ran , MD [phone]

## 2019-02-23 NOTE — ED Notes (Signed)
Pt went to ct and returned  sleepoing before he went to ct  He came back  Retching again

## 2019-02-23 NOTE — Consult Note (Signed)
Reason for Consult: colon obstruction Referring Physician: Antony Madura PA-C  Christopher Olson is an 53 y.o. male.  HPI: Patient is a 53 year old male who presented to the ED this a.m. with abdominal pain has had cramps over the past week and complains of pain becoming worse over the last 12 hours.  This been associated with nausea and dry heaves.  No emesis or hemoptysis.  He has been having bowel movements but none in the last 12 hours.  Last bowel movement was yesterday he says it was normal.  No blood in stool.  Prior surgical history includes an appendectomy, he is followed by Dr. Loreta Ave from GI.  He has a history of precancerous polyps approximately 20 years ago.  He undergoes colonoscopy every 5 years because of this.  His last colonoscopy was in 2017.  He had an episode of diverticulitis in 2013, and one prior to that.  He cannot remember when that was.  Both episodes were treated as an outpatient with medications only.  Work-up in the ED shows a potassium of 3.1, CO2 of 18, glucose of 154 total bilirubin of 1.3.  The CMP is normal lipase is 34.  WBC is 16.8, hemoglobin 16.7, hematocrit 49.7, platelets 302,000.  Urinalysis shows negative nitrates 11-20 WBC per high-powered field.  CT scan of the abdomen pelvis shows sigmoid diverticulosis there is a 6 cm segmental area of thickening in the sigmoid colon most likely muscular hypertrophy and with luminal stricture secondary to prior diverticulitis.  Underlying mass is favored less likely but not entirely excluded there is an associated luminal narrowing and a high-grade obstruction distention of stool-filled colon proximal to this segment of the distended colon measures up to 9 cm in diameter secondary to stricture in the sigmoid colon is noted more distally there is no evidence of small bowel obstruction.  The appendix was not visualized.  No inflammatory changes in the right lower quadrant.  Asked to see.  Past Medical History:  Diagnosis Date  .  Diverticulitis Pre cancerous polyps 20 years ago - q102yr colonoscopy's last 2017 Meniere's disease Hx asthma -he uses an inhaler about once a year.     History reviewed. No pertinent surgical history.  No family history on file.  Social History:  reports that he has never smoked. He has never used smokeless tobacco. He reports current alcohol use. He reports that he does not use drugs. EtOH: 2 drinks per day Drugs: None Tobacco: None He is married to 1 of the hospital chaplain's.  Is an Retail buyer at [employer]. Allergies: No Known Allergies  Prior to Admission medications   Not on File     Results for orders placed or performed during the hospital encounter of 02/23/19 (from the past 48 hour(s))  Urinalysis, Routine w reflex microscopic     Status: Abnormal   Collection Time: 02/23/19  2:36 AM  Result Value Ref Range   Color, Urine AMBER (A) YELLOW    Comment: BIOCHEMICALS MAY BE AFFECTED BY COLOR   APPearance HAZY (A) CLEAR   Specific Gravity, Urine 1.024 1.005 - 1.030   pH 5.0 5.0 - 8.0   Glucose, UA NEGATIVE NEGATIVE mg/dL   Hgb urine dipstick NEGATIVE NEGATIVE   Bilirubin Urine NEGATIVE NEGATIVE   Ketones, ur 80 (A) NEGATIVE mg/dL   Protein, ur 30 (A) NEGATIVE mg/dL   Nitrite NEGATIVE NEGATIVE   Leukocytes,Ua SMALL (A) NEGATIVE   RBC / HPF 0-5 0 - 5 RBC/hpf  WBC, UA 11-20 0 - 5 WBC/hpf   Bacteria, UA FEW (A) NONE SEEN   Squamous Epithelial / LPF 0-5 0 - 5   Mucus PRESENT     Comment: Performed at [facility], [address], [place], [place] [address]  Lipase, blood     Status: None   Collection Time: 02/23/19  2:53 AM  Result Value Ref Range   Lipase 34 11 - 51 U/L    Comment: Performed at [facility], [address] [place], [place] [address]  Comprehensive metabolic panel     Status: Abnormal   Collection Time: 02/23/19  2:53 AM  Result Value Ref Range   Sodium 137 135 - 145 mmol/L   Potassium 3.1 (L) 3.5 - 5.1  mmol/L   Chloride 107 98 - 111 mmol/L   CO2 18 (L) 22 - 32 mmol/L   Glucose, Bld 154 (H) 70 - 99 mg/dL   BUN 10 6 - 20 mg/dL   Creatinine, Ser 0.98 0.61 - 1.24 mg/dL   Calcium 9.2 8.9 - 11.9 mg/dL   Total Protein 7.2 6.5 - 8.1 g/dL   Albumin 4.3 3.5 - 5.0 g/dL   AST 21 15 - 41 U/L   ALT 18 0 - 44 U/L   Alkaline Phosphatase 61 38 - 126 U/L   Total Bilirubin 1.3 (H) 0.3 - 1.2 mg/dL   GFR calc non Af Amer >60 >60 mL/min   GFR calc Af Amer >60 >60 mL/min   Anion gap 12 5 - 15    Comment: Performed at [facility], [address], [place], [place] [address]  CBC     Status: Abnormal   Collection Time: 02/23/19  2:53 AM  Result Value Ref Range   WBC 16.8 (H) 4.0 - 10.5 K/uL   RBC 5.61 4.22 - 5.81 MIL/uL   Hemoglobin 16.7 13.0 - 17.0 g/dL   HCT 95.6 21.3 - 08.6 %   MCV 88.6 80.0 - 100.0 fL   MCH 29.8 26.0 - 34.0 pg   MCHC 33.6 30.0 - 36.0 g/dL   RDW 57.8 46.9 - 62.9 %   Platelets 302 150 - 400 K/uL   nRBC 0.0 0.0 - 0.2 %    Comment: Performed at [facility], [address], [place], [place] [address]    Ct Abdomen Pelvis W Contrast  Result Date: 02/23/2019 CLINICAL DATA:  53 year old male with generalized abdominal pain and nausea. History of sigmoid diverticulitis. EXAM: CT ABDOMEN AND PELVIS WITH CONTRAST TECHNIQUE: Multidetector CT imaging of the abdomen and pelvis was performed using the standard protocol following bolus administration of intravenous contrast. CONTRAST:  OMNIPAQUE IOHEXOL 300 MG/ML  SOLN COMPARISON:  CT of the abdomen pelvis dated 10/04/2012 FINDINGS: Lower chest: The visualized lung bases are clear. No intra-abdominal free air or free fluid. Hepatobiliary: The liver is unremarkable. No intrahepatic biliary ductal dilatation. The gallbladder is unremarkable. Pancreas: Unremarkable. No pancreatic ductal dilatation or surrounding inflammatory changes. Spleen: Normal in size without focal abnormality. Adrenals/Urinary Tract: Adrenal glands are  unremarkable. Kidneys are normal, without renal calculi, focal lesion, or hydronephrosis. Bladder is unremarkable. Stomach/Bowel: There is sigmoid diverticulosis. There is a 6 cm segmental area of thickening of the sigmoid colon (series 3 image 65 and coronal series 6, image 87) most likely muscular hypertrophy and with luminal stricture secondary to prior diverticulitis. Underlying mass is favored less likely but not entirely excluded. Follow-up with colonoscopy is recommended. There is associated  luminal narrowing and high-grade obstruction with distension of stool filled colon proximal to this segment. The distended colon measures approximately 9 cm in diameter. A second area of stricture in the sigmoid colon is noted more distally. There is no evidence of small-bowel obstruction. The appendix is not visualized with certainty. No inflammatory changes identified in the right lower quadrant. Vascular/Lymphatic: The abdominal aorta and IVC are grossly unremarkable. No portal venous gas. There is no adenopathy. Reproductive: The the prostate and seminal vesicles are grossly unremarkable. Other: None Musculoskeletal: Bilateral L5 pars defects. No listhesis. No acute osseous pathology. IMPRESSION: 1. High-grade obstruction of the sigmoid colon secondary to a segmental stricture sequela of prior diverticulitis. Underlying mass is favored less likely but not entirely excluded. Clinical correlation and follow-up with colonoscopy recommended. Diffusely dilated colon proximal to the strictures sigmoid segment measures up to 9 cm in diameter. 2. Colonic diverticulosis. Electronically Signed   By: Elgie Collard M.D.   On: 02/23/2019 06:58    Review of Systems  Constitutional: Positive for weight loss (not eating over the last week, make him feel worse.).  HENT: Positive for hearing loss (60% right ear  Hx of Meniere's dz).   Eyes: Negative.   Respiratory: Negative.        Hiccups rather continuous leads to dry  heaves. He has a history of asthma worse as a child.  He has an episode about once a year.  Currently he does not have an inhaler at home.  Cardiovascular: Negative.   Gastrointestinal: Positive for abdominal pain (Mid abdomen centralized no specific focal sites.), heartburn (Occasional/rare) and nausea. Negative for blood in stool, constipation, diarrhea, melena and vomiting (Having a lot of dry heaves).       Last BM was yesterday and he reports it was normal.  No blood in any of his stools.  Genitourinary: Negative.   Musculoskeletal: Negative.   Skin: Negative.   Neurological: Negative.   Endo/Heme/Allergies: Negative.   Psychiatric/Behavioral: The patient is nervous/anxious.    Blood pressure (!) 163/87, pulse 82, temperature 98.7 F (37.1 C), temperature source Oral, resp. rate 18, height 5\' 6"  (1.676 m), weight 92.5 kg, SpO2 95 %. Physical Exam  Constitutional: He is oriented to person, place, and time. He appears well-developed and well-nourished. No distress.  Having continuous hiccups while I was there, could barely talk.  His wife did a majority of the talking.  HENT:  Head: Normocephalic and atraumatic.  Mouth/Throat: Oropharynx is clear and moist. No oropharyngeal exudate.  Eyes: Right eye exhibits no discharge. Left eye exhibits no discharge. No scleral icterus.  Pupils are equal  Neck: Normal range of motion. Neck supple. No JVD present. No tracheal deviation present. No thyromegaly present.  Cardiovascular: Normal rate, regular rhythm, normal heart sounds and intact distal pulses.  No murmur heard. Respiratory: Effort normal and breath sounds normal. No respiratory distress. He has no wheezes. He has no rales. He exhibits no tenderness.  GI: Soft. He exhibits distension (Very distended and uncomfortable.). He exhibits no mass. There is no abdominal tenderness. There is no rebound and no guarding.  Bowel sounds are diminished.  There is no focal tenderness.  His belly soft,  he just complains of kind of mid centralized abdominal discomfort that is persistent and becoming progressively worse.  Musculoskeletal:        General: No tenderness or edema.  Lymphadenopathy:    He has no cervical adenopathy.  Neurological: He is alert and oriented to person, place, and time.  No cranial nerve deficit.  Skin: Skin is warm and dry. No rash noted. He is not diaphoretic. No erythema. No pallor.  Psychiatric: He has a normal mood and affect. His behavior is normal. Judgment and thought content normal.    Assessment/Plan: History of prior diverticulitis Recurrent diverticulitis with probable stricture versus mass Hx precancerous polyps ~20 years ago -colonoscopy every 5 years/last 2017 Hx asthma Mnire's disease -right ear 60% hearing loss  Plan: Started on IV antibiotics, medicine admission, GI evaluation.  Dr. Maisie Fus will see and review CT scan, and follow with you.  I have added Zosyn, IV fluids, CEA and checking magnesium.  Lauriel Helin 02/23/2019, 7:29 AM

## 2019-02-23 NOTE — ED Triage Notes (Signed)
Pt c/o abdominal pain with nausea/vomiting x 12 hours. Denies diarrhea.

## 2019-02-24 ENCOUNTER — Inpatient Hospital Stay (HOSPITAL_COMMUNITY): Payer: BC Managed Care – PPO

## 2019-02-24 ENCOUNTER — Encounter (HOSPITAL_COMMUNITY)
Admission: EM | Disposition: A | Discharge status: Home Health | Payer: Self-pay | Source: Home / Self Care | Attending: Internal Medicine

## 2019-02-24 ENCOUNTER — Inpatient Hospital Stay (HOSPITAL_COMMUNITY): Payer: BC Managed Care – PPO | Admitting: Certified Registered"

## 2019-02-24 ENCOUNTER — Encounter (HOSPITAL_COMMUNITY): Payer: Self-pay | Admitting: Certified Registered"

## 2019-02-24 DIAGNOSIS — R739 Hyperglycemia, unspecified: Secondary | ICD-10-CM

## 2019-02-24 DIAGNOSIS — J189 Pneumonia, unspecified organism: Secondary | ICD-10-CM

## 2019-02-24 HISTORY — PX: LAPAROTOMY: SHX154

## 2019-02-24 HISTORY — PX: COLON RESECTION: SHX5231

## 2019-02-24 HISTORY — DX: Pneumonia, unspecified organism: J18.9

## 2019-02-24 LAB — BASIC METABOLIC PANEL
Anion gap: 10 (ref 5–15)
BUN: 13 mg/dL (ref 6–20)
CO2: 19 mmol/L — ABNORMAL LOW (ref 22–32)
Calcium: 8.4 mg/dL — ABNORMAL LOW (ref 8.9–10.3)
Chloride: 114 mmol/L — ABNORMAL HIGH (ref 98–111)
Creatinine, Ser: 0.88 mg/dL (ref 0.61–1.24)
GFR calc Af Amer: >60 mL/min (ref >60)
GFR calc non Af Amer: >60 mL/min (ref >60)
Glucose, Bld: 134 mg/dL — ABNORMAL HIGH (ref 70–99)
Potassium: 3.6 mmol/L (ref 3.5–5.1)
Sodium: 143 mmol/L (ref 135–145)

## 2019-02-24 LAB — CEA: CEA: 1.6 ng/mL (ref 0.0–4.7)

## 2019-02-24 LAB — CBC
HCT: 42 % (ref 39.0–52.0)
Hemoglobin: 14.6 g/dL (ref 13.0–17.0)
MCH: 30.9 pg (ref 26.0–34.0)
MCHC: 34.8 g/dL (ref 30.0–36.0)
MCV: 89 fL (ref 80.0–100.0)
Platelets: 247 10*3/uL (ref 150–400)
RBC: 4.72 MIL/uL (ref 4.22–5.81)
RDW: 12.6 % (ref 11.5–15.5)
WBC: 16.1 10*3/uL — ABNORMAL HIGH (ref 4.0–10.5)
nRBC: 0 % (ref 0.0–0.2)

## 2019-02-24 LAB — SURGICAL PCR SCREEN
MRSA, PCR: NEGATIVE
Staphylococcus aureus: POSITIVE — AB

## 2019-02-24 LAB — HIV ANTIBODY (ROUTINE TESTING W REFLEX): HIV Screen 4th Generation wRfx: NONREACTIVE

## 2019-02-24 SURGERY — LAPAROTOMY, EXPLORATORY
Anesthesia: General

## 2019-02-24 MED ORDER — HYDROMORPHONE HCL 1 MG/ML IJ SOLN
INTRAMUSCULAR | Status: AC
  Filled 2019-02-24: qty 1

## 2019-02-24 MED ORDER — MIDAZOLAM HCL 2 MG/2ML IJ SOLN
INTRAMUSCULAR | Status: AC
  Filled 2019-02-24: qty 2

## 2019-02-24 MED ORDER — DEXAMETHASONE SODIUM PHOSPHATE 10 MG/ML IJ SOLN
INTRAMUSCULAR | Status: AC
  Filled 2019-02-24: qty 1

## 2019-02-24 MED ORDER — SCOPOLAMINE 1 MG/3DAYS TD PT72: 1.0000 | MEDICATED_PATCH | Freq: Once | TRANSDERMAL | Status: DC

## 2019-02-24 MED ORDER — DEXAMETHASONE SODIUM PHOSPHATE 10 MG/ML IJ SOLN
INTRAMUSCULAR | Status: DC | PRN
  Administered 2019-02-24: 10 mg via INTRAVENOUS

## 2019-02-24 MED ORDER — PROMETHAZINE HCL 25 MG/ML IJ SOLN: 6.2500 mg | INTRAMUSCULAR | Status: DC | PRN

## 2019-02-24 MED ORDER — SUCCINYLCHOLINE CHLORIDE 200 MG/10ML IV SOSY
PREFILLED_SYRINGE | INTRAVENOUS | Status: DC | PRN
  Administered 2019-02-24: 120 mg via INTRAVENOUS

## 2019-02-24 MED ORDER — ESMOLOL HCL 100 MG/10ML IV SOLN
INTRAVENOUS | Status: DC | PRN
  Administered 2019-02-24: 30 mg via INTRAVENOUS

## 2019-02-24 MED ORDER — MIDAZOLAM HCL 5 MG/5ML IJ SOLN
INTRAMUSCULAR | Status: DC | PRN
  Administered 2019-02-24: 2 mg via INTRAVENOUS

## 2019-02-24 MED ORDER — BUPIVACAINE HCL (PF) 0.25 % IJ SOLN: INTRAMUSCULAR | Status: DC | PRN

## 2019-02-24 MED ORDER — LIDOCAINE 2% (20 MG/ML) 5 ML SYRINGE
INTRAMUSCULAR | Status: AC
  Filled 2019-02-24: qty 5

## 2019-02-24 MED ORDER — ROCURONIUM BROMIDE 50 MG/5ML IV SOSY
PREFILLED_SYRINGE | INTRAVENOUS | Status: AC
  Filled 2019-02-24: qty 5

## 2019-02-24 MED ORDER — FENTANYL CITRATE (PF) 250 MCG/5ML IJ SOLN
INTRAMUSCULAR | Status: AC
  Filled 2019-02-24: qty 5

## 2019-02-24 MED ORDER — PROPOFOL 10 MG/ML IV BOLUS
INTRAVENOUS | Status: AC
  Filled 2019-02-24: qty 20

## 2019-02-24 MED ORDER — PROPOFOL 10 MG/ML IV BOLUS
INTRAVENOUS | Status: DC | PRN
  Administered 2019-02-24: 100 mg via INTRAVENOUS

## 2019-02-24 MED ORDER — ALBUMIN HUMAN 5 % IV SOLN
INTRAVENOUS | Status: AC
  Filled 2019-02-24: qty 250

## 2019-02-24 MED ORDER — ACETAMINOPHEN 10 MG/ML IV SOLN
INTRAVENOUS | Status: AC
  Filled 2019-02-24: qty 100

## 2019-02-24 MED ORDER — SCOPOLAMINE 1 MG/3DAYS TD PT72
MEDICATED_PATCH | TRANSDERMAL | Status: AC
  Filled 2019-02-24: qty 1

## 2019-02-24 MED ORDER — ONDANSETRON HCL 4 MG/2ML IJ SOLN
INTRAMUSCULAR | Status: DC | PRN
  Administered 2019-02-24: 4 mg via INTRAVENOUS

## 2019-02-24 MED ORDER — ROCURONIUM BROMIDE 50 MG/5ML IV SOSY
PREFILLED_SYRINGE | INTRAVENOUS | Status: DC | PRN
  Administered 2019-02-24: 50 mg via INTRAVENOUS
  Administered 2019-02-24: 30 mg via INTRAVENOUS
  Administered 2019-02-24 (×3): 20 mg via INTRAVENOUS

## 2019-02-24 MED ORDER — FENTANYL CITRATE (PF) 250 MCG/5ML IJ SOLN
INTRAMUSCULAR | Status: DC | PRN
  Administered 2019-02-24 (×8): 50 ug via INTRAVENOUS
  Administered 2019-02-24: 100 ug via INTRAVENOUS

## 2019-02-24 MED ORDER — HYDROMORPHONE HCL 1 MG/ML IJ SOLN
0.2500 mg | INTRAMUSCULAR | Status: DC | PRN
  Administered 2019-02-24 (×3): 0.5 mg via INTRAVENOUS

## 2019-02-24 MED ORDER — 0.9 % SODIUM CHLORIDE (POUR BTL) OPTIME
TOPICAL | Status: DC | PRN
  Administered 2019-02-24 (×2): 2000 mL
  Administered 2019-02-24: 1000 mL

## 2019-02-24 MED ORDER — MIDAZOLAM HCL 2 MG/2ML IJ SOLN
0.5000 mg | Freq: Once | INTRAMUSCULAR | Status: AC | PRN
  Administered 2019-02-24: 1.5 mg via INTRAVENOUS

## 2019-02-24 MED ORDER — ESMOLOL HCL 100 MG/10ML IV SOLN
INTRAVENOUS | Status: AC
  Filled 2019-02-24: qty 10

## 2019-02-24 MED ORDER — LACTATED RINGERS IV SOLN
INTRAVENOUS | Status: DC | PRN
  Administered 2019-02-24 (×3): via INTRAVENOUS

## 2019-02-24 MED ORDER — ACETAMINOPHEN 10 MG/ML IV SOLN
INTRAVENOUS | Status: DC | PRN
  Administered 2019-02-24: 1000 mg via INTRAVENOUS

## 2019-02-24 MED ORDER — SUCCINYLCHOLINE CHLORIDE 200 MG/10ML IV SOSY
PREFILLED_SYRINGE | INTRAVENOUS | Status: AC
  Filled 2019-02-24: qty 10

## 2019-02-24 MED ORDER — ALBUMIN HUMAN 5 % IV SOLN
INTRAVENOUS | Status: DC | PRN
  Administered 2019-02-24: 12:00:00 via INTRAVENOUS

## 2019-02-24 MED ORDER — BUPIVACAINE HCL (PF) 0.25 % IJ SOLN
INTRAMUSCULAR | Status: AC
  Filled 2019-02-24: qty 30

## 2019-02-24 MED ORDER — SUGAMMADEX SODIUM 200 MG/2ML IV SOLN
INTRAVENOUS | Status: DC | PRN
  Administered 2019-02-24: 180 mg via INTRAVENOUS

## 2019-02-24 MED ORDER — LIDOCAINE 2% (20 MG/ML) 5 ML SYRINGE
INTRAMUSCULAR | Status: DC | PRN
  Administered 2019-02-24: 30 mg via INTRAVENOUS

## 2019-02-24 MED ORDER — ONDANSETRON HCL 4 MG/2ML IJ SOLN
INTRAMUSCULAR | Status: AC
  Filled 2019-02-24: qty 2

## 2019-02-24 MED ORDER — MEPERIDINE HCL 50 MG/ML IJ SOLN: 6.2500 mg | INTRAMUSCULAR | Status: DC | PRN

## 2019-02-24 MED ORDER — SCOPOLAMINE 1 MG/3DAYS TD PT72
MEDICATED_PATCH | TRANSDERMAL | Status: DC | PRN
  Administered 2019-02-24: 1 via TRANSDERMAL

## 2019-02-24 MED ORDER — ALBUMIN HUMAN 5 % IV SOLN
12.5000 g | Freq: Once | INTRAVENOUS | Status: AC
  Administered 2019-02-24: 12.5 g via INTRAVENOUS

## 2019-02-24 SURGICAL SUPPLY — 57 items
BIOPATCH RED 1 DISK 7.0 (GAUZE/BANDAGES/DRESSINGS) ×2 IMPLANT
CANISTER WOUND CARE 500ML ATS (WOUND CARE) ×2 IMPLANT
CHLORAPREP W/TINT 26ML (MISCELLANEOUS) ×2 IMPLANT
CLIP VESOCCLUDE LG 6/CT (CLIP) ×2 IMPLANT
COVER SURGICAL LIGHT HANDLE (MISCELLANEOUS) ×4 IMPLANT
DRAIN CHANNEL 19F RND (DRAIN) ×2 IMPLANT
DRAPE HALF SHEET 40X57 (DRAPES) ×2 IMPLANT
DRAPE LAPAROSCOPIC ABDOMINAL (DRAPES) ×2 IMPLANT
DRAPE WARM FLUID 44X44 (DRAPE) ×2 IMPLANT
DRSG TEGADERM 4X4.75 (GAUZE/BANDAGES/DRESSINGS) ×2 IMPLANT
DRSG VAC ATS MED SENSATRAC (GAUZE/BANDAGES/DRESSINGS) ×2 IMPLANT
ELECT BLADE 6.5 EXT (BLADE) ×2 IMPLANT
ELECT CAUTERY BLADE 6.4 (BLADE) ×4 IMPLANT
ELECT REM PT RETURN 9FT ADLT (ELECTROSURGICAL) ×2
ELECTRODE REM PT RTRN 9FT ADLT (ELECTROSURGICAL) ×1 IMPLANT
EVACUATOR SILICONE 100CC (DRAIN) ×2 IMPLANT
GLOVE BIOGEL PI IND STRL 7.0 (GLOVE) ×2 IMPLANT
GLOVE BIOGEL PI IND STRL 7.5 (GLOVE) ×2 IMPLANT
GLOVE BIOGEL PI INDICATOR 7.0 (GLOVE) ×2
GLOVE BIOGEL PI INDICATOR 7.5 (GLOVE) ×2
GLOVE ECLIPSE 7.5 STRL STRAW (GLOVE) ×2 IMPLANT
GLOVE SURG SS PI 7.0 STRL IVOR (GLOVE) ×4 IMPLANT
GOWN STRL REUS W/ TWL LRG LVL3 (GOWN DISPOSABLE) ×6 IMPLANT
GOWN STRL REUS W/ TWL XL LVL3 (GOWN DISPOSABLE) ×1 IMPLANT
GOWN STRL REUS W/TWL LRG LVL3 (GOWN DISPOSABLE) ×6
GOWN STRL REUS W/TWL XL LVL3 (GOWN DISPOSABLE) ×1
KIT BASIN OR (CUSTOM PROCEDURE TRAY) ×2 IMPLANT
KIT OSTOMY DRAINABLE 2.75 STR (WOUND CARE) ×2 IMPLANT
KIT TURNOVER KIT B (KITS) ×2 IMPLANT
LIGASURE IMPACT 36 18CM CVD LR (INSTRUMENTS) ×2 IMPLANT
NEEDLE 22X1 1/2 (OR ONLY) (NEEDLE) ×2 IMPLANT
NS IRRIG 1000ML POUR BTL (IV SOLUTION) ×4 IMPLANT
PACK GENERAL/GYN (CUSTOM PROCEDURE TRAY) ×2 IMPLANT
PAD ARMBOARD 7.5X6 YLW CONV (MISCELLANEOUS) ×2 IMPLANT
PENCIL BUTTON HOLSTER BLD 10FT (ELECTRODE) ×2 IMPLANT
PENCIL SMOKE EVACUATOR (MISCELLANEOUS) ×2 IMPLANT
RELOAD PROXIMATE 75MM BLUE (ENDOMECHANICALS) ×2 IMPLANT
SLEEVE SUCTION CATH 165 (SLEEVE) ×2 IMPLANT
SPONGE LAP 18X18 RF (DISPOSABLE) ×4 IMPLANT
STAPLER CUT CVD 40MM BLUE (STAPLE) ×2 IMPLANT
STAPLER PROXIMATE 75MM BLUE (STAPLE) ×2 IMPLANT
STAPLER VISISTAT 35W (STAPLE) ×2 IMPLANT
SUCTION POOLE TIP (SUCTIONS) ×2 IMPLANT
SUT ETHILON 2 0 FS 18 (SUTURE) ×2 IMPLANT
SUT PDS AB 0 CT 36 (SUTURE) ×4 IMPLANT
SUT PROLENE 2 0 CT2 30 (SUTURE) ×2 IMPLANT
SUT SILK 2 0 (SUTURE) ×1
SUT SILK 2 0 SH CR/8 (SUTURE) ×2 IMPLANT
SUT SILK 2-0 18XBRD TIE 12 (SUTURE) ×1 IMPLANT
SUT SILK 3 0 (SUTURE) ×1
SUT SILK 3 0 SH CR/8 (SUTURE) ×2 IMPLANT
SUT SILK 3-0 18XBRD TIE 12 (SUTURE) ×1 IMPLANT
SUT VIC AB 3-0 SH 18 (SUTURE) ×2 IMPLANT
TOWEL OR 17X26 10 PK STRL BLUE (TOWEL DISPOSABLE) ×2 IMPLANT
TRAY FOLEY MTR SLVR 16FR STAT (SET/KITS/TRAYS/PACK) ×2 IMPLANT
TUBE CONNECTING 12X1/4 (SUCTIONS) ×6 IMPLANT
YANKAUER SUCT BULB TIP NO VENT (SUCTIONS) ×2 IMPLANT

## 2019-02-24 NOTE — Progress Notes (Signed)
PROGRESS NOTE                                                                                                                                                                                                             Patient Demographics:    Christopher Olson, is a 53 y.o. male, DOB - 1966/07/08, MWN:[id number]  Admit date - 02/23/2019   Admitting Physician [name], MD  Outpatient Primary MD for the patient is [name], MD  LOS - 1   Chief Complaint  Patient presents with  . Abdominal Pain       Brief Narrative     53 y.o. male with medical history significant of diverticulitis presenting with abdominal pain.   Distention and discomfort, his work-up significant for: Obstruction at sigmoid level, seen by GI, been obstruction unable to perform colonoscopy, plan for [facility] procedure today.    Subjective:    Christopher Olson today reports nausea, hiccups, abdominal pain, distention and discomfort.   Assessment  & Plan :    Principal Problem:   Colonic obstruction (HCC) Active Problems:   Hyperglycemia   Colonic obstruction -Patient with severe episodic abdominal pain with retching and hiccups  -Imaging shows high-grade colonic obstruction, likely related to sigmoid stricture in the setting of diverticular disease -Patient is quite symptomatic, GT inserted, with minimal relief -GI input, general surgery input greatly appreciated, at this point GI role is limiting, given patient with obstruction, cannot have a bowel prep or colonoscopy, and at risk for colon perforation, general surgery input appreciated, plan for [facility] colectomy today. -Keep n.p.o., keep on IV fluids -Sent was on IV Zosyn for diverticulitis empirically, will continue for 24 hours perioperatively.  Hyperglycemia -May be stress response -Will follow with fasting AM labs  Leukocytosis -He appears uncomfortable, but nontoxic appearing, this is most likely in the  setting of his colon obstruction, will monitor  Code Status : Full  Family Communication  : wife at bedside  Disposition Plan  : pending further work up  Consults  :  GI, General surgery  Procedures  : None  DVT Prophylaxis  :  SCD  Lab Results  Component Value Date   PLT 247 02/24/2019    Antibiotics  :    Anti-infectives (From admission, onward)   Start     Dose/Rate Route Frequency Ordered  Stop   02/23/19 0900  [MAR Hold]  piperacillin-tazobactam (ZOSYN) IVPB 3.375 g     (MAR Hold since Sun 02/24/2019 at 1020. Reason: Transfer to a Procedural area.)   3.375 g 12.5 mL/hr over 240 Minutes Intravenous Every 8 hours 02/23/19 0819          Objective:   Vitals:   02/23/19 2037 02/23/19 2242 02/24/19 0630 02/24/19 0926  BP: 127/90 (!) 142/95 (!) 163/83 (!) 155/102  Pulse: (!) 124 (!) 103 77 (!) 107  Resp: Temp: 99 F (37.2 C) 98.5 F (36.9 C) 98.4 F (36.9 C) 98.2 F (36.8 C)  TempSrc: Oral Oral Oral Oral  SpO2: 92% 94% 95%   Weight:      Height:        Wt Readings from Last 3 Encounters:  02/23/19 92.5 kg     Intake/Output Summary (Last 24 hours) at 02/24/2019 1140 Last data filed at 02/24/2019 1058 Gross per 24 hour  Intake 3471.55 ml  Output 1080 ml  Net 2391.55 ml     Physical Exam  Awake Alert, Oriented X 3, No new F.N deficits, appears to be uncomfortable secondary to abdominal distention and pain Symmetrical Chest wall movement, Good air movement bilaterally, CTAB RRR,No Gallops,Rubs or new Murmurs, No Parasternal Heave +ve B.Sounds, abdomen distended, slightly tender to palpation  No Cyanosis, Clubbing or edema, No new Rash or bruise      Data Review:    CBC Recent Labs  Lab 02/23/19 0253 02/24/19 0205  WBC 16.8* 16.1*  HGB 16.7 14.6  HCT 49.7 42.0  PLT 302 247  MCV 88.6 89.0  MCH 29.8 30.9  MCHC 33.6 34.8  RDW 12.4 12.6    Chemistries  Recent Labs  Lab 02/23/19 0253 02/23/19 0941 02/24/19 0205  NA 137  --  143    K 3.1*  --  3.6  CL 107  --  114*  CO2 18*  --  19*  GLUCOSE 154*  --  134*  BUN 10  --  13  CREATININE 0.91  --  0.88  CALCIUM 9.2  --  8.4*  MG  --  2.3  --   AST 21  --   --   ALT 18  --   --   ALKPHOS 61  --   --   BILITOT 1.3*  --   --    ------------------------------------------------------------------------------------------------------------------ No results for input(s): CHOL, HDL, LDLCALC, TRIG, CHOLHDL, LDLDIRECT in the last 72 hours.  No results found for: HGBA1C ------------------------------------------------------------------------------------------------------------------ No results for input(s): TSH, T4TOTAL, T3FREE, THYROIDAB in the last 72 hours.  Invalid input(s): FREET3 ------------------------------------------------------------------------------------------------------------------ No results for input(s): VITAMINB12, FOLATE, FERRITIN, TIBC, IRON, RETICCTPCT in the last 72 hours.  Coagulation profile No results for input(s): INR, PROTIME in the last 168 hours.  No results for input(s): DDIMER in the last 72 hours.  Cardiac Enzymes No results for input(s): CKMB, TROPONINI, MYOGLOBIN in the last 168 hours.  Invalid input(s): CK ------------------------------------------------------------------------------------------------------------------ No results found for: BNP  Inpatient Medications  Scheduled Meds: Continuous Infusions: . [MAR Hold] sodium chloride Stopped (02/24/19 0241)  . 0.9 % NaCl with KCl 40 mEq / L 125 mL/hr (02/24/19 1006)  . [MAR Hold] chlorproMAZINE (THORAZINE) IV Stopped (02/24/19 0335)  . [MAR Hold] piperacillin-tazobactam (ZOSYN)  IV 3.375 g (02/24/19 1610)   PRN Meds:.[MAR Hold] sodium chloride, 0.9 % irrigation (POUR BTL), [MAR Hold] acetaminophen **OR** [MAR Hold] acetaminophen, bupivacaine (PF), [MAR Hold] chlorproMAZINE (THORAZINE) IV, [  MAR Hold]  HYDROmorphone (DILAUDID) injection, [MAR Hold] LORazepam, [MAR Hold]  ondansetron **OR** [MAR Hold] ondansetron (ZOFRAN) IV, [MAR Hold] promethazine  Micro Results No results found for this or any previous visit (from the past 240 hour(s)).  Radiology Reports Dg Abd 1 View  Result Date: 02/24/2019 CLINICAL DATA:  Follow-up colonic dilatation EXAM: ABDOMEN - 1 VIEW COMPARISON:  02/23/2019 FINDINGS: Nasogastric catheter is noted within the stomach. Persistent colonic dilatation is seen. Mild improvement in the descending colon is noted although the more proximal colon remains distended. No free air is seen. No abnormal mass is noted. IMPRESSION: Slight improvement in colonic dilatation when compared with the prior exam. Electronically Signed   By: Alcide Clever M.D.   On: 02/24/2019 08:20   Dg Abd 1 View  Result Date: 02/23/2019 CLINICAL DATA:  NG Tube placement EXAM: ABDOMEN - 1 VIEW COMPARISON:  CT of the abdomen and pelvis 02/23/2011 FINDINGS: Interval placement of nasogastric tube, tip overlying the level of the stomach. Air is identified within the stomach. There is persistent significant dilatation of large bowel the descending colon measuring up to 9.7 centimeters. No plain film evidence for free intraperitoneal air. Contrast within the bladder. IMPRESSION: 1. Nasogastric tube tip overlying the level of the stomach. 2. Persistent significant dilatation of the colon. Electronically Signed   By: Norva Pavlov M.D.   On: 02/23/2019 17:22   Ct Abdomen Pelvis W Contrast  Result Date: 02/23/2019 CLINICAL DATA:  53 year old male with generalized abdominal pain and nausea. History of sigmoid diverticulitis. EXAM: CT ABDOMEN AND PELVIS WITH CONTRAST TECHNIQUE: Multidetector CT imaging of the abdomen and pelvis was performed using the standard protocol following bolus administration of intravenous contrast. CONTRAST:  OMNIPAQUE IOHEXOL 300 MG/ML  SOLN COMPARISON:  CT of the abdomen pelvis dated 10/04/2012 FINDINGS: Lower chest: The visualized lung bases are clear. No  intra-abdominal free air or free fluid. Hepatobiliary: The liver is unremarkable. No intrahepatic biliary ductal dilatation. The gallbladder is unremarkable. Pancreas: Unremarkable. No pancreatic ductal dilatation or surrounding inflammatory changes. Spleen: Normal in size without focal abnormality. Adrenals/Urinary Tract: Adrenal glands are unremarkable. Kidneys are normal, without renal calculi, focal lesion, or hydronephrosis. Bladder is unremarkable. Stomach/Bowel: There is sigmoid diverticulosis. There is a 6 cm segmental area of thickening of the sigmoid colon (series 3 image 65 and coronal series 6, image 87) most likely muscular hypertrophy and with luminal stricture secondary to prior diverticulitis. Underlying mass is favored less likely but not entirely excluded. Follow-up with colonoscopy is recommended. There is associated luminal narrowing and high-grade obstruction with distension of stool filled colon proximal to this segment. The distended colon measures approximately 9 cm in diameter. A second area of stricture in the sigmoid colon is noted more distally. There is no evidence of small-bowel obstruction. The appendix is not visualized with certainty. No inflammatory changes identified in the right lower quadrant. Vascular/Lymphatic: The abdominal aorta and IVC are grossly unremarkable. No portal venous gas. There is no adenopathy. Reproductive: The the prostate and seminal vesicles are grossly unremarkable. Other: None Musculoskeletal: Bilateral L5 pars defects. No listhesis. No acute osseous pathology. IMPRESSION: 1. High-grade obstruction of the sigmoid colon secondary to a segmental stricture sequela of prior diverticulitis. Underlying mass is favored less likely but not entirely excluded. Clinical correlation and follow-up with colonoscopy recommended. Diffusely dilated colon proximal to the strictures sigmoid segment measures up to 9 cm in diameter. 2. Colonic diverticulosis. Electronically  Signed   By: Elgie Collard M.D.   On:  02/23/2019 06:58     Mliss Fritz Kyron Schlitt M.D on 02/24/2019 at 11:40 AM  Between 7am to 7pm - Pager - [phone]  After 7pm go to [email or web address] - password [facility]  [facility] -  Office  [phone]

## 2019-02-24 NOTE — Transfer of Care (Signed)
Immediate Anesthesia Transfer of Care Note  Patient: Christopher Olson  Procedure(s) Performed: EXPLORATORY LAPAROTOMY (N/A ) COLON RESECTION WITH COLOSTOMY (N/A )  Patient Location: PACU  Anesthesia Type:General  Level of Consciousness: drowsy  Airway & Oxygen Therapy: Patient Spontanous Breathing and Patient connected to nasal cannula oxygen  Post-op Assessment: Report given to RN and Post -op Vital signs reviewed and stable  Post vital signs: Reviewed and stable  Last Vitals:  Vitals Value Taken Time  BP 145/75 02/24/2019  1:03 PM  Temp    Pulse 95 02/24/2019  1:13 PM  Resp 16 02/24/2019  1:13 PM  SpO2 95 % 02/24/2019  1:13 PM  Vitals shown include unvalidated device data.  Last Pain:  Vitals:   02/24/19 0926  TempSrc: Oral  PainSc:       Patients Stated Pain Goal: 0 (02/24/19 0757)  Complications: No apparent anesthesia complications

## 2019-02-24 NOTE — Anesthesia Postprocedure Evaluation (Signed)
Anesthesia Post Note  Patient: Christopher Olson  Procedure(s) Performed: EXPLORATORY LAPAROTOMY (N/A ) COLON RESECTION WITH COLOSTOMY (N/A )     Patient location during evaluation: PACU Anesthesia Type: General Level of consciousness: sedated, patient cooperative and oriented Pain management: pain level controlled Vital Signs Assessment: post-procedure vital signs reviewed and stable Respiratory status: spontaneous breathing, nonlabored ventilation and respiratory function stable Cardiovascular status: blood pressure returned to baseline and stable Postop Assessment: no apparent nausea or vomiting Anesthetic complications: no    Last Vitals:  Vitals:   02/24/19 1448 02/24/19 1503  BP: 138/78 111/76  Pulse: (!) 120 (!) 116  Resp: (!) 23 14  Temp:  37.1 C  SpO2: 97% 93%    Last Pain:  Vitals:   02/24/19 1503  TempSrc:   PainSc: Asleep                 Myria Steenbergen,E. Jamal Pavon

## 2019-02-24 NOTE — Op Note (Signed)
Preoperative diagnosis: sigmoid stricture with obstruction  Postoperative diagnosis: same   Procedure: exploratory laparotomy, partial colectomy with end colostomy, placement of 50cm ^2 negative pressure dressing  Surgeon: Feliciana Rossetti, M.D.  Asst: Jaclynn Guarneri  Anesthesia: general  Indications for procedure: Christopher Olson is a 53 y.o. year old male with symptoms of abdominal pain, abdominal distension found to have a very dilated colon with stricture vs mass in the sigmoid area. After discussing with patient he was consented for partial colon resection with possible ostomy, possible anastomosis with diverting ileostomy.  Description of procedure: The patient was brought into the operative suite. Anesthesia was administered with General endotracheal anesthesia. WHO checklist was applied. The patient was then placed in lithotomy position. The area was prepped and draped in the usual sterile fashion.  Next, a midline incision was made. Cautery was used to dissect through the subcutaneous tissues and fascia. The fascia was safely entered. Upon initial inspection, The left colon and transverse colon were very dilated. Next, the left White line of Toldt was incised moving proximal. A location of healthy colon was identified for proximal margin. Further dissection distally freed the sigmoid and proximal rectum from the surrounding contents. The ureter was identified. A blue load contour staple was used to divide the distal rectum. Ligasure was used to divide the mesentery, one area a kelly and then 3-0 silk stick tie were used to ligate a large vessel. Once beyond the strictured area, a spring clamp was used to clamp across the colon. The colon was divided and specimen of sigmoid colon removed. Alice clamps were attached to the fresh edge of the colon and the colon was decompressed. This resulted in contamination of the abdominal cavity as the suction devices used to keep the area cleaned clogged. The  colon was then stapled across with 2 16mm blue GIA staplers as decision was made to complete a Hartman's colostomy.  A location on the left abdominal wall was chosen for colostomy location. A circular incision was made in the skin and blunt dissection was used to identify the anterior rectus sheath. A cruciate incision was made through the anterior rectus sheath, the muscle fibers were bluntly dissected to identify the posterior rectus sheath and a cruciate incision was made in the posterior rectus sheath and dilated to fit the colon. The colon was passed through the site and clamped extracorporeally.  Next, the abdomen was irrigated with warm saline. The rectal stump was sutured with a 2-0 prolene at each end for future identification. A 12fr blake drain was placed into the right lower quadrant with tip in the pelvis and sutured in place with 2-0 nylon. The fascia was closed using a 0 PDS in running fashion. The subcutaneous wound was covered with a towel. The ostomy was matured with 3-0 vicryl with a small amount of Brooking in the cardinal direction.The skin was left open and packed with a black sponge vac.  Findings: dilated colon with firmness in proximal sigmoid area.  Specimen: sigmoid mass  Implant: medium black sponge vac, 19 fr blake drain   Blood loss:  Local anesthesia: none  Complications: none  Feliciana Rossetti, M.D. General, Bariatric, & Minimally Invasive Surgery [facility]

## 2019-02-24 NOTE — Progress Notes (Signed)
Patient ID: Christopher Olson, male   DOB: [birth year], 53 y.o.   MRN: [id number] Abdominal x-ray shows minimal if any improvement.  Discussed situation with the patient and his wife.  I believe the best course is to proceed with [facility] colectomy.  They understand and agree.  I discussed the nature of the procedure and expected recovery.  He understands he will have a colostomy and this is  reversible in the future.  Discussed risks including anesthetic complications, bleeding, infection or wound healing problems.  All questions answered and they desire to proceed.  Discussed case as well with Dr. [name] who will be primary surgeon and I will assist.  They understand this.  Mariella Saa MD, FACS  02/24/2019, 9:01 AM

## 2019-02-24 NOTE — Progress Notes (Signed)
Patient is agitated with Foley in place, determined to get up, keeps asking to take it out. Dr. [name] notified, does not want the catheter out at this time. Also notified MD of output in JP drain.

## 2019-02-24 NOTE — Anesthesia Procedure Notes (Addendum)
Procedure Name: Intubation Date/Time: 02/24/2019 10:23 AM Performed by: Elliot Dally, CRNA Pre-anesthesia Checklist: Patient identified, Emergency Drugs available, Suction available and Patient being monitored Patient Re-evaluated:Patient Re-evaluated prior to induction Oxygen Delivery Method: Circle System Utilized Preoxygenation: Pre-oxygenation with 100% oxygen Induction Type: IV induction, Cricoid Pressure applied and Rapid sequence Laryngoscope Size: Miller and 3 Grade View: Grade I Tube type: Oral Tube size: 7.5 mm Number of attempts: 1 Airway Equipment and Method: Stylet and Oral airway Placement Confirmation: ETT inserted through vocal cords under direct vision,  positive ETCO2 and breath sounds checked- equal and bilateral Secured at: 22 cm Tube secured with: Tape Dental Injury: Teeth and Oropharynx as per pre-operative assessment

## 2019-02-24 NOTE — Progress Notes (Signed)
Patient ID: Christopher Olson, male   DOB: 19-Oct-1966, 53 y.o.   MRN: [id number]     Subjective: No better.  Abdomen very uncomfortable.  No severe pain.  Has not passed any flatus.  Objective: Vital signs in last 24 hours: Temp:  [98.1 F (36.7 C)-99 F (37.2 C)] 98.4 F (36.9 C) (03/01 0630) Pulse Rate:  [63-124] 77 (03/01 0630) Resp:  [16-18] 16 (03/01 0630) BP: (121-173)/(76-95) 163/83 (03/01 0630) SpO2:  [92 %-100 %] 95 % (03/01 0630) Last BM Date: 02/22/19  Intake/Output from previous day: 02/29 0701 - 03/01 0700 In: 2471.6 [I.V.:2271.4; IV Piggyback:200.1] Out: 1130 [Urine:900; Emesis/NG output:230] Intake/Output this shift: No intake/output data recorded.  General appearance: alert, cooperative and mild distress GI: Marked distention and tympany.  Mild diffuse tenderness without guarding or peritoneal signs.  Lab Results:  Recent Labs    02/23/19 0253 02/24/19 0205  WBC 16.8* 16.1*  HGB 16.7 14.6  HCT 49.7 42.0  PLT 302 247   BMET Recent Labs    02/23/19 0253 02/24/19 0205  NA 137 143  K 3.1* 3.6  CL 107 114*  CO2 18* 19*  GLUCOSE 154* 134*  BUN 10 13  CREATININE 0.91 0.88  CALCIUM 9.2 8.4*     Studies/Results: Dg Abd 1 View  Result Date: 02/24/2019 CLINICAL DATA:  Follow-up colonic dilatation EXAM: ABDOMEN - 1 VIEW COMPARISON:  02/23/2019 FINDINGS: Nasogastric catheter is noted within the stomach. Persistent colonic dilatation is seen. Mild improvement in the descending colon is noted although the more proximal colon remains distended. No free air is seen. No abnormal mass is noted. IMPRESSION: Slight improvement in colonic dilatation when compared with the prior exam. Electronically Signed   By: Alcide Clever M.D.   On: 02/24/2019 08:20   Dg Abd 1 View  Result Date: 02/23/2019 CLINICAL DATA:  NG Tube placement EXAM: ABDOMEN - 1 VIEW COMPARISON:  CT of the abdomen and pelvis 02/23/2011 FINDINGS: Interval placement of nasogastric tube, tip overlying the  level of the stomach. Air is identified within the stomach. There is persistent significant dilatation of large bowel the descending colon measuring up to 9.7 centimeters. No plain film evidence for free intraperitoneal air. Contrast within the bladder. IMPRESSION: 1. Nasogastric tube tip overlying the level of the stomach. 2. Persistent significant dilatation of the colon. Electronically Signed   By: Norva Pavlov M.D.   On: 02/23/2019 17:22   Ct Abdomen Pelvis W Contrast  Result Date: 02/23/2019 CLINICAL DATA:  53 year old male with generalized abdominal pain and nausea. History of sigmoid diverticulitis. EXAM: CT ABDOMEN AND PELVIS WITH CONTRAST TECHNIQUE: Multidetector CT imaging of the abdomen and pelvis was performed using the standard protocol following bolus administration of intravenous contrast. CONTRAST:  OMNIPAQUE IOHEXOL 300 MG/ML  SOLN COMPARISON:  CT of the abdomen pelvis dated 10/04/2012 FINDINGS: Lower chest: The visualized lung bases are clear. No intra-abdominal free air or free fluid. Hepatobiliary: The liver is unremarkable. No intrahepatic biliary ductal dilatation. The gallbladder is unremarkable. Pancreas: Unremarkable. No pancreatic ductal dilatation or surrounding inflammatory changes. Spleen: Normal in size without focal abnormality. Adrenals/Urinary Tract: Adrenal glands are unremarkable. Kidneys are normal, without renal calculi, focal lesion, or hydronephrosis. Bladder is unremarkable. Stomach/Bowel: There is sigmoid diverticulosis. There is a 6 cm segmental area of thickening of the sigmoid colon (series 3 image 65 and coronal series 6, image 87) most likely muscular hypertrophy and with luminal stricture secondary to prior diverticulitis. Underlying mass is favored less likely but not entirely  excluded. Follow-up with colonoscopy is recommended. There is associated luminal narrowing and high-grade obstruction with distension of stool filled colon proximal to this segment.  The distended colon measures approximately 9 cm in diameter. A second area of stricture in the sigmoid colon is noted more distally. There is no evidence of small-bowel obstruction. The appendix is not visualized with certainty. No inflammatory changes identified in the right lower quadrant. Vascular/Lymphatic: The abdominal aorta and IVC are grossly unremarkable. No portal venous gas. There is no adenopathy. Reproductive: The the prostate and seminal vesicles are grossly unremarkable. Other: None Musculoskeletal: Bilateral L5 pars defects. No listhesis. No acute osseous pathology. IMPRESSION: 1. High-grade obstruction of the sigmoid colon secondary to a segmental stricture sequela of prior diverticulitis. Underlying mass is favored less likely but not entirely excluded. Clinical correlation and follow-up with colonoscopy recommended. Diffusely dilated colon proximal to the strictures sigmoid segment measures up to 9 cm in diameter. 2. Colonic diverticulosis. Electronically Signed   By: Elgie Collard M.D.   On: 02/23/2019 06:58    Anti-infectives: Anti-infectives (From admission, onward)   Start     Dose/Rate Route Frequency Ordered Stop   02/23/19 0900  piperacillin-tazobactam (ZOSYN) IVPB 3.375 g     3.375 g 12.5 mL/hr over 240 Minutes Intravenous Every 8 hours 02/23/19 2010        Assessment/Plan: Sigmoid obstruction likely secondary to diverticular disease.  Clinically unchanged.  Slight improvement in KUB in terms of sigmoid distention but overall about the same.  Persistent leukocytosis.  He is very concerned about having a colostomy.  We discussed this in some detail and he is reassured.  Discussed possibly proceeding with resection and colostomy today.  He is processing this information and we will reassess later this morning.    LOS: 1 day    Mariella Saa 02/24/2019

## 2019-02-24 NOTE — Anesthesia Preprocedure Evaluation (Addendum)
Anesthesia Evaluation  Patient identified by MRN, date of birth, ID band Patient awake    Reviewed: Allergy & Precautions, NPO status , Patient's Chart, lab work & pertinent test results  History of Anesthesia Complications Negative for: history of anesthetic complications  Airway Mallampati: II  TM Distance: >3 FB Neck ROM: Full    Dental  (+) Dental Advisory Given   Pulmonary Recent URI ,    breath sounds clear to auscultation       Cardiovascular negative cardio ROS   Rhythm:Regular Rate:Normal     Neuro/Psych Anxiety Meniere's    GI/Hepatic Neg liver ROS, diverticular disease Bowel obstruction: N/V, has NG   Endo/Other  negative endocrine ROS  Renal/GU negative Renal ROS     Musculoskeletal   Abdominal (+) + obese,   Peds  Hematology negative hematology ROS (+)   Anesthesia Other Findings   Reproductive/Obstetrics                            Anesthesia Physical Anesthesia Plan  ASA: II and emergent  Anesthesia Plan: General   Post-op Pain Management:    Induction: Intravenous and Rapid sequence  PONV Risk Score and Plan: 3 and Ondansetron, Dexamethasone and Scopolamine patch - Pre-op  Airway Management Planned: Oral ETT  Additional Equipment:   Intra-op Plan:   Post-operative Plan: Extubation in OR  Informed Consent: I have reviewed the patients History and Physical, chart, labs and discussed the procedure including the risks, benefits and alternatives for the proposed anesthesia with the patient or authorized representative who has indicated his/her understanding and acceptance.     Dental advisory given  Plan Discussed with: CRNA and Surgeon  Anesthesia Plan Comments: (Plan routine monitors, GETA)       Anesthesia Quick Evaluation

## 2019-02-25 ENCOUNTER — Encounter (HOSPITAL_COMMUNITY): Payer: Self-pay | Admitting: General Surgery

## 2019-02-25 LAB — BASIC METABOLIC PANEL
Anion gap: 5 (ref 5–15)
BUN: 13 mg/dL (ref 6–20)
CO2: 24 mmol/L (ref 22–32)
Calcium: 7.9 mg/dL — ABNORMAL LOW (ref 8.9–10.3)
Chloride: 115 mmol/L — ABNORMAL HIGH (ref 98–111)
Creatinine, Ser: 0.85 mg/dL (ref 0.61–1.24)
GFR calc Af Amer: >60 mL/min (ref >60)
GFR calc non Af Amer: >60 mL/min (ref >60)
Glucose, Bld: 164 mg/dL — ABNORMAL HIGH (ref 70–99)
Potassium: 4.5 mmol/L (ref 3.5–5.1)
Sodium: 144 mmol/L (ref 135–145)

## 2019-02-25 LAB — CBC
HCT: 34.9 % — ABNORMAL LOW (ref 39.0–52.0)
Hemoglobin: 11.6 g/dL — ABNORMAL LOW (ref 13.0–17.0)
MCH: 30.2 pg (ref 26.0–34.0)
MCHC: 33.2 g/dL (ref 30.0–36.0)
MCV: 90.9 fL (ref 80.0–100.0)
Platelets: 238 10*3/uL (ref 150–400)
RBC: 3.84 MIL/uL — ABNORMAL LOW (ref 4.22–5.81)
RDW: 13.2 % (ref 11.5–15.5)
WBC: 11.1 10*3/uL — ABNORMAL HIGH (ref 4.0–10.5)
nRBC: 0 % (ref 0.0–0.2)

## 2019-02-25 MED ORDER — METHOCARBAMOL 1000 MG/10ML IJ SOLN
500.0000 mg | Freq: Three times a day (TID) | INTRAVENOUS | Status: DC
  Administered 2019-02-25 – 2019-02-27 (×6): 500 mg via INTRAVENOUS
  Filled 2019-02-25 (×4): qty 5
  Filled 2019-02-25: qty 500
  Filled 2019-02-25 (×2): qty 5

## 2019-02-25 MED ORDER — KETOROLAC TROMETHAMINE 30 MG/ML IJ SOLN
30.0000 mg | Freq: Four times a day (QID) | INTRAMUSCULAR | Status: DC
  Administered 2019-02-25 – 2019-02-27 (×8): 30 mg via INTRAVENOUS
  Filled 2019-02-25 (×8): qty 1

## 2019-02-25 MED ORDER — WHITE PETROLATUM EX OINT
TOPICAL_OINTMENT | CUTANEOUS | Status: AC
  Administered 2019-02-25: 0.2
  Filled 2019-02-25: qty 28.35

## 2019-02-25 MED ORDER — ENOXAPARIN SODIUM 40 MG/0.4ML ~~LOC~~ SOLN
40.0000 mg | SUBCUTANEOUS | Status: DC
  Administered 2019-02-25 – 2019-02-27 (×3): 40 mg via SUBCUTANEOUS
  Filled 2019-02-25 (×3): qty 0.4

## 2019-02-25 MED ORDER — ACETAMINOPHEN 10 MG/ML IV SOLN
1000.0000 mg | Freq: Four times a day (QID) | INTRAVENOUS | Status: AC
  Administered 2019-02-25 – 2019-02-26 (×4): 1000 mg via INTRAVENOUS
  Filled 2019-02-25 (×4): qty 100

## 2019-02-25 MED ORDER — SODIUM CHLORIDE 0.9 % IV SOLN
INTRAVENOUS | Status: DC
  Administered 2019-02-25 – 2019-02-27 (×4): via INTRAVENOUS

## 2019-02-25 NOTE — Care Management Note (Signed)
Case Management Note  Patient Details  Name: Christopher Olson MRN: [id number] Date of Birth: 02-01-1966  Subjective/Objective:                    Action/Plan:  Per chart patient from home with wife and children. From progression patient very anxious today. Currently patient has VAC and drain.   Will continue to follow. Started [facility] application and placed in shadow chart. If home VAC needed will need form signed by MD/PA/NP. Will also need wound measurements. WOC consulted.   Will continue to follow for wound care and drain care needs.  Expected Discharge Date:                  Expected Discharge Plan:  Home w Home Health Services  In-House Referral:     Discharge planning Services  CM Consult  Post Acute Care Choice:  Home Health Choice offered to:     DME Arranged:    DME Agency:     HH Arranged:  RN HH Agency:     Status of Service:  In process, will continue to follow  If discussed at Long Length of Stay Meetings, dates discussed:    Additional Comments:  Kingsley Plan, RN 02/25/2019, 11:52 AM

## 2019-02-25 NOTE — Plan of Care (Signed)
  Problem: Education: Goal: Knowledge of General Education information will improve Description: Including pain rating scale, medication(s)/side effects and non-pharmacologic comfort measures Outcome: Progressing   Problem: Health Behavior/Discharge Planning: Goal: Ability to manage health-related needs will improve Outcome: Progressing   Problem: Clinical Measurements: Goal: Ability to maintain clinical measurements within normal limits will improve Outcome: Progressing Goal: Will remain free from infection Outcome: Progressing Goal: Diagnostic test results will improve Outcome: Progressing Goal: Respiratory complications will improve Outcome: Progressing Goal: Cardiovascular complication will be avoided Outcome: Progressing   Problem: Activity: Goal: Risk for activity intolerance will decrease Outcome: Progressing   Problem: Nutrition: Goal: Adequate nutrition will be maintained Outcome: Progressing   Problem: Coping: Goal: Level of anxiety will decrease Outcome: Progressing   Problem: Elimination: Goal: Will not experience complications related to bowel motility Outcome: Progressing Goal: Will not experience complications related to urinary retention Outcome: Progressing   Problem: Pain Managment: Goal: General experience of comfort will improve Outcome: Progressing   Problem: Safety: Goal: Ability to remain free from injury will improve Outcome: Progressing   Problem: Skin Integrity: Goal: Risk for impaired skin integrity will decrease Outcome: Progressing   Problem: Clinical Measurements: Goal: Ability to maintain clinical measurements within normal limits will improve Outcome: Progressing Goal: Postoperative complications will be avoided or minimized Outcome: Progressing   Problem: Skin Integrity: Goal: Demonstration of wound healing without infection will improve Outcome: Progressing   

## 2019-02-25 NOTE — Consult Note (Signed)
WOC Nurse ostomy consult note Stoma type/location: LUQ, transverse colostomy Stomal assessment/size: aprox 1 5/8" visualized in the pouch  Peristomal assessment: NA Treatment options for stomal/peristomal skin: NA Output brown, liquid  Ostomy pouching: 2pc. 2 3/4" from OR Education provided:  Explained role of ostomy nurse and creation of stoma  Education on emptying when 1/3 to 1/2 full and how to empty Demonstrated use of wick to clean spout   Patient is very nervous and anxious about most everything.  He is engaged however.  He will need a lot of support along the way.  Wife is chaplin in the [facility]    Enrolled patient in [place] [employer] DC program: Yes  WOC Nurse will follow along with you for continued support with ostomy teaching and care Quindell Shere [place] MSN, RN, [place], CNS, [place] [phone]

## 2019-02-25 NOTE — Progress Notes (Signed)
PROGRESS NOTE                                                                                                                                                                                                             Patient Demographics:    Christopher Olson, is a 53 y.o. male, DOB - Sep 09, 1966, KGO:[id number]  Admit date - 02/23/2019   Admitting Physician [name], MD  Outpatient Primary MD for the patient is [name], MD  LOS - 2   Chief Complaint  Patient presents with  . Abdominal Pain       Brief Narrative     53 y.o. male with medical history significant of diverticulitis presenting with abdominal pain.   Distention and discomfort, his work-up significant for: Obstruction at sigmoid level, seen by GI, been obstruction unable to perform colonoscopy, surgery has been consulted, went for exploratory laparotomy, with partial colectomy and end colostomy with wound VAC placement by [name] 02/24/2019    Subjective:    Christopher Olson today reports pain, ports he is thirsty, asking when he can drink, 800 cc output through his NGT tube.   Assessment  & Plan :    Principal Problem:   Colonic obstruction (HCC) Active Problems:   Hyperglycemia   Colonic obstruction -Patient with severe episodic abdominal pain with retching and hiccups  -Imaging shows high-grade colonic obstruction, likely related to sigmoid stricture in the setting of diverticular disease -Patient is quite symptomatic, NGT inserted, with minimal relief -GI input, general surgery input greatly appreciated, at this point GI role is limiting, given patient with obstruction, cannot have a bowel prep or colonoscopy, and at risk for colon perforation, -General surgery input greatly appreciated, status post exploratory laparotomy, with partial colectomy and end colostomy with wound VAC placement by Dr. [name] 02/24/2019 -He remains n.p.o. today, pain management and diet per  general surgery -on IV Zosyn, day 3,  Hyperglycemia -May be stress response   Leukocytosis -Trending down  Code Status : Full  Family Communication  :none at bedside  Disposition Plan  : pending further work up  Consults  :  GI, General surgery  Procedures  : status post exploratory laparotomy, with partial colectomy and end colostomy with wound VAC placement by Dr. [name] 02/24/2019  DVT Prophylaxis  :  [place] lovenox  Lab Results  Component  Value Date   PLT 238 02/25/2019    Antibiotics  :    Anti-infectives (From admission, onward)   Start     Dose/Rate Route Frequency Ordered Stop   02/23/19 0900  piperacillin-tazobactam (ZOSYN) IVPB 3.375 g     3.375 g 12.5 mL/hr over 240 Minutes Intravenous Every 8 hours 02/23/19 0819          Objective:   Vitals:   02/24/19 1530 02/24/19 2035 02/25/19 0355 02/25/19 1052  BP: (!) 145/76 (!) 134/96 129/79 125/74  Pulse: 92 (!) 101 (!) 102 (!) 101  Resp: 20 19 19  (!) 24  Temp: 99 F (37.2 C) 99.8 F (37.7 C) 100.2 F (37.9 C) 99.8 F (37.7 C)  TempSrc: Oral Oral Oral Oral  SpO2: 96% 97% 96% 94%  Weight:      Height:        Wt Readings from Last 3 Encounters:  02/23/19 92.5 kg     Intake/Output Summary (Last 24 hours) at 02/25/2019 1401 Last data filed at 02/25/2019 1300 Gross per 24 hour  Intake 2885.67 ml  Output 2400 ml  Net 485.67 ml     Physical Exam  Awake Alert, Oriented X 3, No new F.N deficits, mild discomfort secondary to pain Symmetrical Chest wall movement, Good air movement bilaterally, CTAB RRR,No Gallops,Rubs or new Murmurs, No Parasternal Heave Diminished bowel sounds, colostomy bag with feculent material, midline surgical wound covered with wound VAC , has JP drain as well  No Cyanosis, Clubbing or edema, No new Rash or bruise       Data Review:    CBC Recent Labs  Lab 02/23/19 0253 02/24/19 0205 02/25/19 0223  WBC 16.8* 16.1* 11.1*  HGB 16.7 14.6 11.6*  HCT 49.7 42.0 34.9*  PLT  302 247 238  MCV 88.6 89.0 90.9  MCH 29.8 30.9 30.2  MCHC 33.6 34.8 33.2  RDW 12.4 12.6 13.2    Chemistries  Recent Labs  Lab 02/23/19 0253 02/23/19 0941 02/24/19 0205 02/25/19 0223  NA 137  --  143 144  K 3.1*  --  3.6 4.5  CL 107  --  114* 115*  CO2 18*  --  19* 24  GLUCOSE 154*  --  134* 164*  BUN 10  --  13 13  CREATININE 0.91  --  0.88 0.85  CALCIUM 9.2  --  8.4* 7.9*  MG  --  2.3  --   --   AST 21  --   --   --   ALT 18  --   --   --   ALKPHOS 61  --   --   --   BILITOT 1.3*  --   --   --    ------------------------------------------------------------------------------------------------------------------ No results for input(s): CHOL, HDL, LDLCALC, TRIG, CHOLHDL, LDLDIRECT in the last 72 hours.  No results found for: HGBA1C ------------------------------------------------------------------------------------------------------------------ No results for input(s): TSH, T4TOTAL, T3FREE, THYROIDAB in the last 72 hours.  Invalid input(s): FREET3 ------------------------------------------------------------------------------------------------------------------ No results for input(s): VITAMINB12, FOLATE, FERRITIN, TIBC, IRON, RETICCTPCT in the last 72 hours.  Coagulation profile No results for input(s): INR, PROTIME in the last 168 hours.  No results for input(s): DDIMER in the last 72 hours.  Cardiac Enzymes No results for input(s): CKMB, TROPONINI, MYOGLOBIN in the last 168 hours.  Invalid input(s): CK ------------------------------------------------------------------------------------------------------------------ No results found for: BNP  Inpatient Medications  Scheduled Meds: . enoxaparin (LOVENOX) injection  40 mg Subcutaneous Q24H  . ketorolac  30 mg Intravenous Q6H  Continuous Infusions: . sodium chloride Stopped (02/24/19 1610)  . 0.9 % NaCl with KCl 40 mEq / L 125 mL/hr (02/25/19 0824)  . acetaminophen 1,000 mg (02/25/19 1131)  . chlorproMAZINE  (THORAZINE) IV Stopped (02/24/19 0335)  . methocarbamol (ROBAXIN) IV    . piperacillin-tazobactam (ZOSYN)  IV 3.375 g (02/25/19 0607)   PRN Meds:.sodium chloride, chlorproMAZINE (THORAZINE) IV, HYDROmorphone (DILAUDID) injection, LORazepam, ondansetron **OR** ondansetron (ZOFRAN) IV, promethazine  Micro Results Recent Results (from the past 240 hour(s))  Surgical pcr screen     Status: Abnormal   Collection Time: 02/24/19  9:17 AM  Result Value Ref Range Status   MRSA, PCR NEGATIVE NEGATIVE Final   Staphylococcus aureus POSITIVE (A) NEGATIVE Final    Comment: (NOTE) The Xpert SA Assay (FDA approved for NASAL specimens in patients 95 years of age and older), is one component of a comprehensive surveillance program. It is not intended to diagnose infection nor to guide or monitor treatment. Performed at [facility], [address] [place], [place] [address]     Radiology Reports Dg Abd 1 View  Result Date: 02/24/2019 CLINICAL DATA:  Follow-up colonic dilatation EXAM: ABDOMEN - 1 VIEW COMPARISON:  02/23/2019 FINDINGS: Nasogastric catheter is noted within the stomach. Persistent colonic dilatation is seen. Mild improvement in the descending colon is noted although the more proximal colon remains distended. No free air is seen. No abnormal mass is noted. IMPRESSION: Slight improvement in colonic dilatation when compared with the prior exam. Electronically Signed   By: Alcide Clever M.D.   On: 02/24/2019 08:20   Dg Abd 1 View  Result Date: 02/23/2019 CLINICAL DATA:  NG Tube placement EXAM: ABDOMEN - 1 VIEW COMPARISON:  CT of the abdomen and pelvis 02/23/2011 FINDINGS: Interval placement of nasogastric tube, tip overlying the level of the stomach. Air is identified within the stomach. There is persistent significant dilatation of large bowel the descending colon measuring up to 9.7 centimeters. No plain film evidence for free intraperitoneal air. Contrast within the bladder. IMPRESSION:  1. Nasogastric tube tip overlying the level of the stomach. 2. Persistent significant dilatation of the colon. Electronically Signed   By: Norva Pavlov M.D.   On: 02/23/2019 17:22   Ct Abdomen Pelvis W Contrast  Result Date: 02/23/2019 CLINICAL DATA:  53 year old male with generalized abdominal pain and nausea. History of sigmoid diverticulitis. EXAM: CT ABDOMEN AND PELVIS WITH CONTRAST TECHNIQUE: Multidetector CT imaging of the abdomen and pelvis was performed using the standard protocol following bolus administration of intravenous contrast. CONTRAST:  OMNIPAQUE IOHEXOL 300 MG/ML  SOLN COMPARISON:  CT of the abdomen pelvis dated 10/04/2012 FINDINGS: Lower chest: The visualized lung bases are clear. No intra-abdominal free air or free fluid. Hepatobiliary: The liver is unremarkable. No intrahepatic biliary ductal dilatation. The gallbladder is unremarkable. Pancreas: Unremarkable. No pancreatic ductal dilatation or surrounding inflammatory changes. Spleen: Normal in size without focal abnormality. Adrenals/Urinary Tract: Adrenal glands are unremarkable. Kidneys are normal, without renal calculi, focal lesion, or hydronephrosis. Bladder is unremarkable. Stomach/Bowel: There is sigmoid diverticulosis. There is a 6 cm segmental area of thickening of the sigmoid colon (series 3 image 65 and coronal series 6, image 87) most likely muscular hypertrophy and with luminal stricture secondary to prior diverticulitis. Underlying mass is favored less likely but not entirely excluded. Follow-up with colonoscopy is recommended. There is associated luminal narrowing and high-grade obstruction with distension of stool filled colon proximal to this segment. The distended colon measures approximately 9 cm in  diameter. A second area of stricture in the sigmoid colon is noted more distally. There is no evidence of small-bowel obstruction. The appendix is not visualized with certainty. No inflammatory changes identified  in the right lower quadrant. Vascular/Lymphatic: The abdominal aorta and IVC are grossly unremarkable. No portal venous gas. There is no adenopathy. Reproductive: The the prostate and seminal vesicles are grossly unremarkable. Other: None Musculoskeletal: Bilateral L5 pars defects. No listhesis. No acute osseous pathology. IMPRESSION: 1. High-grade obstruction of the sigmoid colon secondary to a segmental stricture sequela of prior diverticulitis. Underlying mass is favored less likely but not entirely excluded. Clinical correlation and follow-up with colonoscopy recommended. Diffusely dilated colon proximal to the strictures sigmoid segment measures up to 9 cm in diameter. 2. Colonic diverticulosis. Electronically Signed   By: Elgie Collard M.D.   On: 02/23/2019 06:58     Huey Bienenstock M.D on 02/25/2019 at 2:01 PM  Between 7am to 7pm - Pager - [phone]  After 7pm go to [email or web address] - password [facility]  [facility] -  Office  [phone]

## 2019-02-25 NOTE — Progress Notes (Signed)
Initial Nutrition Assessment  DOCUMENTATION CODES:   Obesity unspecified  INTERVENTION:   -RD will follow for diet advancement and supplement as approprate  NUTRITION DIAGNOSIS:   Increased nutrient needs related to post-op healing, wound healing as evidenced by estimated needs.  GOAL:   Patient will meet greater than or equal to 90% of their needs  MONITOR:   Diet advancement, Labs, Weight trends, Skin, I & O's  REASON FOR ASSESSMENT:   Other (Comment)    ASSESSMENT:   Christopher Olson is a 53 y.o. male with medical history significant of diverticulitis presenting with abdominal pain.  For the last week or so, he has been having "more rumbly gas that wasn't exiting.  The last time he saw Dr. Loreta Ave and got Rifaximin for a massive floral imbalance.  He was thinking that was what it was again".  He called GI and was told to take the medication (rifaximin and Augmentin) for a second round.  He took it yesterday but started with dry heaves from 2pm yesterday.  He gets episodic horrible cramping and then heaving, up to every 7 minutes.  He was also having violent hiccups.  He is overall better right now, temporarily.  No fevers.  Last BM was yesterday AM and was normal.  Pain is diffuse and kind of moves around but is periumbilical.  Pt admitted with colonic obstruction.   3/2- s/p Procedure: exploratory laparotomy, partial colectomy with end colostomy, placement of 50cm ^2 negative pressure dressing  Reviewed I/O's: +2.6 L x 24 hours and +5.9 L since admission  NGT output: 450 ml x 24 hours  Drains: 416 ml x 24 hours  RD pulled to chart per request of pt wife (who is a Emergency planning/management officer).   Spoke with pt at bedside, who reports he is hungry and hopes to be able to eat soon. PTA, pt with a good appetite; he consumed 3 meals per day (Breakfast: yogurt with granola; Lunch: sandwich; Dinner: "junk like burgers and pizza"). Pt shares that he is motivated to make better food choices  once he is discharged.   Pt denies any weight loss. He reports UBW is around 200#. Pt reports he had been intentionally trying to lose weight PTA (started running 3 miles PTA daily for the past year) and believes that his eating habit as impeding his weight loss. No wt hx available to assess weight changes.   Discussed with pt rationale for NPO diet order (per general surgery notes, potential plan for NGT removal tomorrow). Discussed with pt importance of good nutritional intake (especially protein) to aid in post-healing, especially with the presence of wound vac. Pt amenable to supplements once diet is advanced.   Labs reviewed.   NUTRITION - FOCUSED PHYSICAL EXAM:    Most Recent Value  Orbital Region  No depletion  Upper Arm Region  No depletion  Thoracic and Lumbar Region  No depletion  Buccal Region  No depletion  Temple Region  No depletion  Clavicle Bone Region  No depletion  Clavicle and Acromion Bone Region  No depletion  Scapular Bone Region  No depletion  Dorsal Hand  No depletion  Patellar Region  No depletion  Anterior Thigh Region  No depletion  Posterior Calf Region  No depletion  Edema (RD Assessment)  None  Hair  Reviewed  Eyes  Reviewed  Mouth  Reviewed  Skin  Reviewed  Nails  Reviewed       Diet Order:   Diet Order  Diet NPO time specified  Diet effective now              EDUCATION NEEDS:   Education needs have been addressed  Skin:  Skin Assessment: Skin Integrity Issues: Skin Integrity Issues:: Wound VAC Wound Vac: abdomen  Last BM:  02/25/19 (200 ml output via colostomy)  Height:   Ht Readings from Last 1 Encounters:  02/23/19 5\' 6"  (1.676 m)    Weight:   Wt Readings from Last 1 Encounters:  02/23/19 92.5 kg    Ideal Body Weight:  64.5 kg  BMI:  Body mass index is 32.93 kg/m.  Estimated Nutritional Needs:   Kcal:  1950-2150  Protein:  115-130 grams  Fluid:  > 1.9 L    Hadyn Blanck A. Mayford Knife, RD, LDN,  CDCES Registered Dietitian II Certified Diabetes Care and Education Specialist Pager: [phone] After hours Pager: [phone]

## 2019-02-25 NOTE — Progress Notes (Signed)
   02/25/19 1300  Clinical Encounter Type  Visited With Family;Health care provider  Visit Type Initial  Referral From Nurse  Spiritual Encounters  Spiritual Needs Emotional  Stress Factors  Patient Stress Factors Loss of control;Exhausted;Major life changes   Intro visit w/ pt, with CPE supervisor shadowing.  Pt wasn't feeling well, let RN know of his concerns.    Margretta Sidle resident, [phone]

## 2019-02-25 NOTE — Progress Notes (Signed)
1 Day Post-Op    CC: Abdominal pain nausea and vomiting  Subjective: Patient is pretty miserable this a.m.  Having a fair amount of pain.  He has had 800 out through his NG tube and is still draining a fair amount.  Midline wound VAC is in place.  His ostomy is putting out a fair amount of stool also.  He has incentive spirometer, but is only moving about 700 I told him to aim for 1000 today.  Objective: Vital signs in last 24 hours: Temp:  [97.9 F (36.6 C)-100.2 F (37.9 C)] 100.2 F (37.9 C) (03/02 0355) Pulse Rate:  [88-120] 102 (03/02 0355) Resp:  [10-23] 19 (03/02 0355) BP: (111-155)/(75-102) 129/79 (03/02 0355) SpO2:  [91 %-97 %] 96 % (03/02 0355) Last BM Date: 02/22/19 N.p.o. 4895 IV 1150 urine 450 NG Drain 416 200 stool 1 temp 100.2 at 3 AM, other vital signs are stable Labs: WBC down to 11.1.  Electrolytes are stable. Intake/Output from previous day: 03/01 0701 - 03/02 0700 In: 4895.7 [I.V.:4499.2; IV Piggyback:396.4] Out: 2316 [Urine:1150; Emesis/NG output:450; Drains:416; Stool:200; Blood:100] Intake/Output this shift: No intake/output data recorded.  General appearance: alert, cooperative and Still having a lot of pain and discomfort. Resp: clear to auscultation bilaterally and Currently moving about 700 on incentive spirometer. GI: Tender painful.  Midline wound VAC in place.  NG is put out about 800 so far.  Ostomy bag is full of loose brown stool.  Lab Results:  Recent Labs    02/24/19 0205 02/25/19 0223  WBC 16.1* 11.1*  HGB 14.6 11.6*  HCT 42.0 34.9*  PLT 247 238    BMET Recent Labs    02/24/19 0205 02/25/19 0223  NA 143 144  K 3.6 4.5  CL 114* 115*  CO2 19* 24  GLUCOSE 134* 164*  BUN 13 13  CREATININE 0.88 0.85  CALCIUM 8.4* 7.9*   PT/INR No results for input(s): LABPROT, INR in the last 72 hours.  Recent Labs  Lab 02/23/19 0253  AST 21  ALT 18  ALKPHOS 61  BILITOT 1.3*  PROT 7.2  ALBUMIN 4.3     Lipase     Component  Value Date/Time   LIPASE 34 02/23/2019 0253   . sodium chloride Stopped (02/24/19 2952)  . 0.9 % NaCl with KCl 40 mEq / L 125 mL/hr (02/25/19 0824)  . chlorproMAZINE (THORAZINE) IV Stopped (02/24/19 0335)  . 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 Franky Macho Kinsinger POD #1  FEN: IV fluids/n.p.o. ID:   Zosyn 2/29 >> day 3 DVT: SCDs/resume Lovenox this evening 1600 Follow-up: Dr. [name]  Plan: Added IV Tylenol and Robaxin to his pain medications.  I offered him a PCA but he was not interested in having the CO2 monitor.  I will work on getting him up in the chair later.  I have asked wound care to see him to help with his ostomy and wound VAC.  WBC is improving.    LOS: 2 days    Anja Neuzil 02/25/2019 [phone]

## 2019-02-26 LAB — CBC
HEMATOCRIT: 29.7 % — AB (ref 39.0–52.0)
Hemoglobin: 9.8 g/dL — ABNORMAL LOW (ref 13.0–17.0)
MCH: 30.1 pg (ref 26.0–34.0)
MCHC: 33 g/dL (ref 30.0–36.0)
MCV: 91.1 fL (ref 80.0–100.0)
Platelets: 198 10*3/uL (ref 150–400)
RBC: 3.26 MIL/uL — ABNORMAL LOW (ref 4.22–5.81)
RDW: 12.9 % (ref 11.5–15.5)
WBC: 10.1 10*3/uL (ref 4.0–10.5)
nRBC: 0 % (ref 0.0–0.2)

## 2019-02-26 LAB — BASIC METABOLIC PANEL
Anion gap: 8 (ref 5–15)
BUN: 12 mg/dL (ref 6–20)
CO2: 23 mmol/L (ref 22–32)
Calcium: 7.7 mg/dL — ABNORMAL LOW (ref 8.9–10.3)
Chloride: 113 mmol/L — ABNORMAL HIGH (ref 98–111)
Creatinine, Ser: 0.87 mg/dL (ref 0.61–1.24)
GFR calc Af Amer: >60 mL/min (ref >60)
GFR calc non Af Amer: >60 mL/min (ref >60)
Glucose, Bld: 110 mg/dL — ABNORMAL HIGH (ref 70–99)
Potassium: 3.8 mmol/L (ref 3.5–5.1)
Sodium: 144 mmol/L (ref 135–145)

## 2019-02-26 MED ORDER — ADULT MULTIVITAMIN W/MINERALS CH
1.0000 | ORAL_TABLET | Freq: Every day | ORAL | Status: DC
  Administered 2019-02-26 – 2019-02-28 (×3): 1 via ORAL
  Filled 2019-02-26 (×3): qty 1

## 2019-02-26 MED ORDER — CHLORHEXIDINE GLUCONATE CLOTH 2 % EX PADS
6.0000 | MEDICATED_PAD | Freq: Every day | CUTANEOUS | Status: DC
  Administered 2019-02-26 – 2019-02-28 (×3): 6 via TOPICAL

## 2019-02-26 MED ORDER — BOOST / RESOURCE BREEZE PO LIQD CUSTOM
1.0000 | Freq: Three times a day (TID) | ORAL | Status: DC
  Administered 2019-02-26 – 2019-02-27 (×3): 1 via ORAL

## 2019-02-26 MED ORDER — MUPIROCIN 2 % EX OINT
1.0000 "application " | TOPICAL_OINTMENT | Freq: Two times a day (BID) | CUTANEOUS | Status: DC
  Administered 2019-02-26 – 2019-02-28 (×5): 1 via NASAL
  Filled 2019-02-26: qty 22

## 2019-02-26 MED ORDER — GUAIFENESIN-DM 100-10 MG/5ML PO SYRP
5.0000 mL | ORAL_SOLUTION | ORAL | Status: DC | PRN
  Administered 2019-02-26 – 2019-02-28 (×2): 5 mL via ORAL
  Filled 2019-02-26 (×2): qty 5

## 2019-02-26 NOTE — Plan of Care (Signed)
  Problem: Activity: Goal: Risk for activity intolerance will decrease Outcome: Progressing   Problem: Nutrition: Goal: Adequate nutrition will be maintained Outcome: Progressing   Problem: Coping: Goal: Level of anxiety will decrease Outcome: Progressing   Problem: Elimination: Goal: Will not experience complications related to bowel motility Outcome: Progressing Goal: Will not experience complications related to urinary retention Outcome: Progressing   Problem: Pain Managment: Goal: General experience of comfort will improve Outcome: Progressing   Problem: Safety: Goal: Ability to remain free from injury will improve Outcome: Progressing   Problem: Skin Integrity: Goal: Risk for impaired skin integrity will decrease Outcome: Progressing   Problem: Clinical Measurements: Goal: Ability to maintain clinical measurements within normal limits will improve Outcome: Progressing Goal: Postoperative complications will be avoided or minimized Outcome: Progressing   Problem: Skin Integrity: Goal: Demonstration of wound healing without infection will improve Outcome: Progressing

## 2019-02-26 NOTE — Care Management Note (Addendum)
Case Management Note  Patient Details  Name: Christopher Olson MRN: [id number] Date of Birth: Feb 12, 1966  Subjective/Objective:                    Action/Plan:Dan with [facility] accepted referral for home health RN. Once wound care is determined for post discharge will need HHRN orders. Talked to [employer] with [facility]. Unsure at this point if patient will discharge with VAC or wet to dry dressing changes. Will aware VAC application in shadow chart for signature if home VAC needed.    Discussed discharge planning with patient and wife at bedside. Confirmed face sheet information.   Discussed if patient discharges with wound VAC ( negative wound pressure system) NCM will order home VAC through [facility]. Home VAC and dressing supplies will be delivered to patient's room prior to discharge. [facility] RN will connect home VAC which is smaller than hospital prior to discharge home. HHRN will change home VAC three times a week, usually Monday , Wednesday, and Friday.   If patient discharges with wet to dry. Bedside nurse will train patient and wife how to change wet to dry dressings prior to discharge, because home health RN will not make daily visits.    WOC nurse and bedside nurse will also train patient and wife on ostomy care prior to discharge. WOC nurse has enrolled patient in [employer] DC Program for supplies.    Provided Medicare.gov list. Patient and wife would like [facility]. Referral given to [facility]. Await call call to see if [facility] can accept. Expected Discharge Date:                  Expected Discharge Plan:  Home w Home Health Services  In-House Referral:     Discharge planning Services  CM Consult  Post Acute Care Choice:  Home Health Choice offered to:  Patient, Spouse  DME Arranged:  Vac DME Agency:  [facility]  HH Arranged:  RN HH Agency:     Status of Service:  In process, will continue to follow  If discussed at Long Length of Stay Meetings, dates  discussed:    Additional Comments:  Kingsley Plan, RN 02/26/2019, 10:03 AM

## 2019-02-26 NOTE — Progress Notes (Signed)
Nutrition Follow-up  DOCUMENTATION CODES:   Obesity unspecified  INTERVENTION:   -Boost Breeze po TID, each supplement provides 250 kcal and 9 grams of protein -MVI with minerals daily -RD will continue to follow for diet advancement and adjust supplement regimen as appropriate  NUTRITION DIAGNOSIS:   Increased nutrient needs related to post-op healing, wound healing as evidenced by estimated needs.  Ongoing  GOAL:   Patient will meet greater than or equal to 90% of their needs  Progressing  MONITOR:   PO intake, Supplement acceptance, Diet advancement, Labs, Weight trends, Skin, I & O's  REASON FOR ASSESSMENT:   Other (Comment)    ASSESSMENT:   Christopher Olson is a 53 y.o. male with medical history significant of diverticulitis presenting with abdominal pain.  For the last week or so, he has been having "more rumbly gas that wasn't exiting.  The last time he saw Dr. Loreta Ave and got Rifaximin for a massive floral imbalance.  He was thinking that was what it was again".  He called GI and was told to take the medication (rifaximin and Augmentin) for a second round.  He took it yesterday but started with dry heaves from 2pm yesterday.  He gets episodic horrible cramping and then heaving, up to every 7 minutes.  He was also having violent hiccups.  He is overall better right now, temporarily.  No fevers.  Last BM was yesterday AM and was normal.  Pain is diffuse and kind of moves around but is periumbilical.  3/2- s/p Procedure:exploratory laparotomy, partial colectomy with end colostomy, placement of 50cm ^2 negative pressure dressing, NGT d/c  Reviewed I/O's: -1.4 L x 24 hours and +4.5 L since admission  Drain output: 95 ml x 24 hours   Colostomy output: 750 ml x 24 hours  Chart reviewed; noted NGT was removed after RD visit yesterday. Pt diet has advanced to sips of clears from the floor, with likely plans to advance to clear liquids diet later on today. RD will add supplements  per agreement with pt yesterday to help optimize nutritional status and healing.   Labs reviewed.   Diet Order:   Diet Order            Diet clear liquid Room service appropriate? No; Fluid consistency: Thin  Diet effective now              EDUCATION NEEDS:   Education needs have been addressed  Skin:  Skin Assessment: Skin Integrity Issues: Skin Integrity Issues:: Wound VAC Wound Vac: abdomen  Last BM:  02/26/19 (400 ml output via colostomy)  Height:   Ht Readings from Last 1 Encounters:  02/23/19 5\' 6"  (1.676 m)    Weight:   Wt Readings from Last 1 Encounters:  02/23/19 92.5 kg    Ideal Body Weight:  64.5 kg  BMI:  Body mass index is 32.93 kg/m.  Estimated Nutritional Needs:   Kcal:  1950-2150  Protein:  115-130 grams  Fluid:  > 1.9 L    Armonte Tortorella A. Mayford Knife, RD, LDN, CDCES Registered Dietitian II Certified Diabetes Care and Education Specialist Pager: [phone] After hours Pager: [phone]

## 2019-02-26 NOTE — Consult Note (Signed)
WOC Nurse ostomy follow up Stoma type/location: LUQ, end colostomy  Stomal assessment/size: 2" round, budded  Peristomal assessment: intact  Treatment options for stomal/peristomal skin: 2" barrier ring Output: liquid brown Ostomy pouching: 2pc. 2 3/4" pouching system Education provided:  Explained role of ostomy nurse and creation of stoma  Explained stoma characteristics (budded, flush, color, texture, care) Demonstrated pouch change (cutting new skin barrier, measuring stoma, cleaning peristomal skin and stoma, use of barrier ring) Education on emptying when 1/3 to 1/2 full and how to empty Discussed and re-demonstrated use of wick to clean spout  Discussed bathing, diet, gas, medication use, constipation Discussed risk of peristomal hernia, will need to limit exercise program for time. Would suggest exercise that does not increase abdominal pressure (sit ups,  Provided patient with [employer] and marked items currently using Answered patient/family questions related to diet, shower, gas control.   Enrolled patient in [place] Secure Start Discharge program: Yes  Will plan to change NPWT dressing Wednesday 02/27/19, wife to be present. Notified [facility] PA for this plan.  Will communicate with bedside nurse for pain medication prior to the dressing change.   WOC Nurse will follow along with you for continued support with ostomy teaching and care and support with NPWT dressings.  Kianah Harries [facility] MSN, RN, [place], CNS, [place] [phone]

## 2019-02-26 NOTE — Progress Notes (Signed)
2 Days Post-Op    CC: Abdominal pain  Subjective: He is very anxious and impatient currently really wants something to drink.  He has a fair amount coming out through his ostomy.  But bowel sounds are fairly hypoactive.  Wound VAC is in place and I will get that when they change it today.  Objective: Vital signs in last 24 hours: Temp:  [98.7 F (37.1 C)-99.8 F (37.7 C)] 99 F (37.2 C) (03/03 0646) Pulse Rate:  [80-104] 80 (03/03 0646) Resp:  [18-24] 18 (03/03 0646) BP: (125-138)/(74-81) 135/78 (03/03 0646) SpO2:  [93 %-99 %] 99 % (03/03 0646) Last BM Date: 02/26/19 To 40 p.o. Urine 425 NG 400, then it was lost. Drain 95 Stool 750 Afebrile since 7 AM yesterday. Tachycardia somewhat better. Sats are good on room air. BMP is stable BC is down to 10.1. H&H down slightly. Intake/Output from previous day: 03/02 0701 - 03/03 0700 In: 240 [P.O.:240] Out: 1670 [Urine:425; Emesis/NG output:400; Drains:95; Stool:750] Intake/Output this shift: Total I/O In: 0  Out: 400 [Stool:400]  General appearance: alert, cooperative, no distress and Very anxious Resp: clear to auscultation bilaterally GI: Soft, sore, and output through the ostomy.  Bowel sounds are still hypoactive.  Midline incision has a wound VAC in place we will look at that later today. Male genitalia: normal, He feels his testicle is enlarged which is new.  Questionable hydrocele  Lab Results:  Recent Labs    02/25/19 0223 02/26/19 0126  WBC 11.1* 10.1  HGB 11.6* 9.8*  HCT 34.9* 29.7*  PLT 238 198    BMET Recent Labs    02/25/19 0223 02/26/19 0126  NA 144 144  K 4.5 3.8  CL 115* 113*  CO2 24 23  GLUCOSE 164* 110*  BUN 13 12  CREATININE 0.85 0.87  CALCIUM 7.9* 7.7*   PT/INR No results for input(s): LABPROT, INR in the last 72 hours.  Recent Labs  Lab 02/23/19 0253  AST 21  ALT 18  ALKPHOS 61  BILITOT 1.3*  PROT 7.2  ALBUMIN 4.3     Lipase     Component Value Date/Time   LIPASE 34  02/23/2019 0253     Medications: . Chlorhexidine Gluconate Cloth  6 each Topical Q0600  . enoxaparin (LOVENOX) injection  40 mg Subcutaneous Q24H  . ketorolac  30 mg Intravenous Q6H  . 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 Franky Macho Kinsinger POD #1  FEN: IV fluids/n.p.o. ID:   Zosyn 2/29 >> day 3 DVT: SCDs/resume Lovenox this evening 1600 Follow-up: Dr. [name]  Plan: DC Pennie Rushing him some clears from the floor, mobilize, change wound vac today.  He may need something for anxiety, he is very anxious about everything.       LOS: 3 days    Ligia Duguay 02/26/2019 [phone]

## 2019-02-26 NOTE — Progress Notes (Signed)
PROGRESS NOTE                                                                                                                                                                                                             Patient Demographics:    Christopher Olson, is a 53 y.o. male, DOB - 1966-12-24, UJW:[id number]  Admit date - 02/23/2019   Admitting Physician [name], MD  Outpatient Primary MD for the patient is [name], MD  LOS - 3   Chief Complaint  Patient presents with  . Abdominal Pain       Brief Narrative     53 y.o. male with medical history significant of diverticulitis presenting with abdominal pain.   Distention and discomfort, his work-up significant for: Obstruction at sigmoid level, seen by GI, been obstruction unable to perform colonoscopy, surgery has been consulted, went for exploratory laparotomy, with partial colectomy and end colostomy with wound VAC placement by [name] 02/24/2019    Subjective:    Christopher Olson today ports he is feeling better today, no pain has significantly improved, no nausea or vomiting, asking if he can drink some sips of water, complains of scrotal swelling, but no pain .   Assessment  & Plan :    Principal Problem:   Colonic obstruction (HCC) Active Problems:   Hyperglycemia   Colonic obstruction -Patient with severe episodic abdominal pain with retching and hiccups  -Imaging shows high-grade colonic obstruction, likely related to sigmoid stricture in the setting of diverticular disease -Patient is quite symptomatic, NGT inserted, with minimal relief -GI input, general surgery input greatly appreciated, at this point GI role is limiting, given patient with obstruction, cannot have a bowel prep or colonoscopy, and at risk for colon perforation, -General surgery input greatly appreciated, status post exploratory laparotomy, with partial colectomy and end colostomy with wound VAC  placement by Dr. [name] 02/24/2019 -on IV Zosyn, day 4, -Management  per general surgery, his NGT was discontinued today and started on clear liquid diet  Hyperglycemia -May be stress response  Leukocytosis -Trending down  Patient has concern of scrotal edema, denies any pain, have told him this is secondary to volume overload, I will obtain ultrasound scrotum for reassurance  Code Status : Full  Family Communication  :wife at bedside  Disposition Plan  : pending further work  up  Consults  :  GI, General surgery  Procedures  : status post exploratory laparotomy, with partial colectomy and end colostomy with wound VAC placement by Dr. [name] 02/24/2019  DVT Prophylaxis  :  [place] lovenox  Lab Results  Component Value Date   PLT 198 02/26/2019    Antibiotics  :    Anti-infectives (From admission, onward)   Start     Dose/Rate Route Frequency Ordered Stop   02/23/19 0900  piperacillin-tazobactam (ZOSYN) IVPB 3.375 g     3.375 g 12.5 mL/hr over 240 Minutes Intravenous Every 8 hours 02/23/19 0819          Objective:   Vitals:   02/25/19 1419 02/25/19 2059 02/26/19 0646 02/26/19 1344  BP: 136/81 138/74 135/78 139/73  Pulse: (!) 104 99 80 90  Resp: 20 18 18 16   Temp: 99.6 F (37.6 C) 98.7 F (37.1 C) 99 F (37.2 C) 99 F (37.2 C)  TempSrc: Oral Oral Oral Oral  SpO2: 93% 94% 99% 96%  Weight:      Height:        Wt Readings from Last 3 Encounters:  02/23/19 92.5 kg     Intake/Output Summary (Last 24 hours) at 02/26/2019 1407 Last data filed at 02/26/2019 0839 Gross per 24 hour  Intake 0 ml  Output 1110 ml  Net -1110 ml     Physical Exam  Awake Alert, Oriented X 3, No new F.N deficits, Normal affect Symmetrical Chest wall movement, Good air movement bilaterally, CTAB RRR,No Gallops,Rubs or new Murmurs, No Parasternal Heave Bowel sounds present, abdomen less distended, colostomy bag in left lower abdomen, midline surgical wound with wound VAC No Cyanosis,  Clubbing or edema, No new Rash or bruise   Patient has mild scrotal edema, no masses could be felt or erythema noted     Data Review:    CBC Recent Labs  Lab 02/23/19 0253 02/24/19 0205 02/25/19 0223 02/26/19 0126  WBC 16.8* 16.1* 11.1* 10.1  HGB 16.7 14.6 11.6* 9.8*  HCT 49.7 42.0 34.9* 29.7*  PLT 302 247 238 198  MCV 88.6 89.0 90.9 91.1  MCH 29.8 30.9 30.2 30.1  MCHC 33.6 34.8 33.2 33.0  RDW 12.4 12.6 13.2 12.9    Chemistries  Recent Labs  Lab 02/23/19 0253 02/23/19 0941 02/24/19 0205 02/25/19 0223 02/26/19 0126  NA 137  --  143 144 144  K 3.1*  --  3.6 4.5 3.8  CL 107  --  114* 115* 113*  CO2 18*  --  19* 24 23  GLUCOSE 154*  --  134* 164* 110*  BUN 10  --  13 13 12   CREATININE 0.91  --  0.88 0.85 0.87  CALCIUM 9.2  --  8.4* 7.9* 7.7*  MG  --  2.3  --   --   --   AST 21  --   --   --   --   ALT 18  --   --   --   --   ALKPHOS 61  --   --   --   --   BILITOT 1.3*  --   --   --   --    ------------------------------------------------------------------------------------------------------------------ No results for input(s): CHOL, HDL, LDLCALC, TRIG, CHOLHDL, LDLDIRECT in the last 72 hours.  No results found for: HGBA1C ------------------------------------------------------------------------------------------------------------------ No results for input(s): TSH, T4TOTAL, T3FREE, THYROIDAB in the last 72 hours.  Invalid input(s): FREET3 ------------------------------------------------------------------------------------------------------------------ No results for input(s): VITAMINB12, FOLATE, FERRITIN, TIBC,  IRON, RETICCTPCT in the last 72 hours.  Coagulation profile No results for input(s): INR, PROTIME in the last 168 hours.  No results for input(s): DDIMER in the last 72 hours.  Cardiac Enzymes No results for input(s): CKMB, TROPONINI, MYOGLOBIN in the last 168 hours.  Invalid input(s):  CK ------------------------------------------------------------------------------------------------------------------ No results found for: BNP  Inpatient Medications  Scheduled Meds: . Chlorhexidine Gluconate Cloth  6 each Topical Q0600  . enoxaparin (LOVENOX) injection  40 mg Subcutaneous Q24H  . feeding supplement  1 Container Oral TID BM  . ketorolac  30 mg Intravenous Q6H  . multivitamin with minerals  1 tablet Oral Daily  . mupirocin ointment  1 application Nasal BID   Continuous Infusions: . sodium chloride Stopped (02/24/19 5830)  . sodium chloride 100 mL/hr at 02/26/19 1147  . chlorproMAZINE (THORAZINE) IV Stopped (02/24/19 0335)  . methocarbamol (ROBAXIN) IV 500 mg (02/26/19 1341)  . piperacillin-tazobactam (ZOSYN)  IV 3.375 g (02/26/19 1341)   PRN Meds:.sodium chloride, chlorproMAZINE (THORAZINE) IV, guaiFENesin-dextromethorphan, HYDROmorphone (DILAUDID) injection, LORazepam, ondansetron **OR** ondansetron (ZOFRAN) IV, promethazine  Micro Results Recent Results (from the past 240 hour(s))  Surgical pcr screen     Status: Abnormal   Collection Time: 02/24/19  9:17 AM  Result Value Ref Range Status   MRSA, PCR NEGATIVE NEGATIVE Final   Staphylococcus aureus POSITIVE (A) NEGATIVE Final    Comment: (NOTE) The Xpert SA Assay (FDA approved for NASAL specimens in patients 35 years of age and older), is one component of a comprehensive surveillance program. It is not intended to diagnose infection nor to guide or monitor treatment. Performed at [facility], [address] [place], [place] [address]     Radiology Reports Dg Abd 1 View  Result Date: 02/24/2019 CLINICAL DATA:  Follow-up colonic dilatation EXAM: ABDOMEN - 1 VIEW COMPARISON:  02/23/2019 FINDINGS: Nasogastric catheter is noted within the stomach. Persistent colonic dilatation is seen. Mild improvement in the descending colon is noted although the more proximal colon remains distended. No free air is  seen. No abnormal mass is noted. IMPRESSION: Slight improvement in colonic dilatation when compared with the prior exam. Electronically Signed   By: Alcide Clever M.D.   On: 02/24/2019 08:20   Dg Abd 1 View  Result Date: 02/23/2019 CLINICAL DATA:  NG Tube placement EXAM: ABDOMEN - 1 VIEW COMPARISON:  CT of the abdomen and pelvis 02/23/2011 FINDINGS: Interval placement of nasogastric tube, tip overlying the level of the stomach. Air is identified within the stomach. There is persistent significant dilatation of large bowel the descending colon measuring up to 9.7 centimeters. No plain film evidence for free intraperitoneal air. Contrast within the bladder. IMPRESSION: 1. Nasogastric tube tip overlying the level of the stomach. 2. Persistent significant dilatation of the colon. Electronically Signed   By: Norva Pavlov M.D.   On: 02/23/2019 17:22   Ct Abdomen Pelvis W Contrast  Result Date: 02/23/2019 CLINICAL DATA:  53 year old male with generalized abdominal pain and nausea. History of sigmoid diverticulitis. EXAM: CT ABDOMEN AND PELVIS WITH CONTRAST TECHNIQUE: Multidetector CT imaging of the abdomen and pelvis was performed using the standard protocol following bolus administration of intravenous contrast. CONTRAST:  OMNIPAQUE IOHEXOL 300 MG/ML  SOLN COMPARISON:  CT of the abdomen pelvis dated 10/04/2012 FINDINGS: Lower chest: The visualized lung bases are clear. No intra-abdominal free air or free fluid. Hepatobiliary: The liver is unremarkable. No intrahepatic biliary ductal dilatation. The gallbladder is unremarkable. Pancreas: Unremarkable. No pancreatic ductal dilatation  or surrounding inflammatory changes. Spleen: Normal in size without focal abnormality. Adrenals/Urinary Tract: Adrenal glands are unremarkable. Kidneys are normal, without renal calculi, focal lesion, or hydronephrosis. Bladder is unremarkable. Stomach/Bowel: There is sigmoid diverticulosis. There is a 6 cm segmental area of  thickening of the sigmoid colon (series 3 image 65 and coronal series 6, image 87) most likely muscular hypertrophy and with luminal stricture secondary to prior diverticulitis. Underlying mass is favored less likely but not entirely excluded. Follow-up with colonoscopy is recommended. There is associated luminal narrowing and high-grade obstruction with distension of stool filled colon proximal to this segment. The distended colon measures approximately 9 cm in diameter. A second area of stricture in the sigmoid colon is noted more distally. There is no evidence of small-bowel obstruction. The appendix is not visualized with certainty. No inflammatory changes identified in the right lower quadrant. Vascular/Lymphatic: The abdominal aorta and IVC are grossly unremarkable. No portal venous gas. There is no adenopathy. Reproductive: The the prostate and seminal vesicles are grossly unremarkable. Other: None Musculoskeletal: Bilateral L5 pars defects. No listhesis. No acute osseous pathology. IMPRESSION: 1. High-grade obstruction of the sigmoid colon secondary to a segmental stricture sequela of prior diverticulitis. Underlying mass is favored less likely but not entirely excluded. Clinical correlation and follow-up with colonoscopy recommended. Diffusely dilated colon proximal to the strictures sigmoid segment measures up to 9 cm in diameter. 2. Colonic diverticulosis. Electronically Signed   By: Elgie CollardArash  Radparvar M.D.   On: 02/23/2019 06:58     Huey Bienenstockawood Iris Tatsch M.D on 02/26/2019 at 2:07 PM  Between 7am to 7pm - Pager - [phone]  After 7pm go to [email or web address] - password [email or web address]  [facility] -  Office  [phone]

## 2019-02-27 DIAGNOSIS — N5089 Other specified disorders of the male genital organs: Secondary | ICD-10-CM

## 2019-02-27 DIAGNOSIS — D72829 Elevated white blood cell count, unspecified: Secondary | ICD-10-CM

## 2019-02-27 LAB — CBC
HCT: 30.6 % — ABNORMAL LOW (ref 39.0–52.0)
Hemoglobin: 9.9 g/dL — ABNORMAL LOW (ref 13.0–17.0)
MCH: 30 pg (ref 26.0–34.0)
MCHC: 32.4 g/dL (ref 30.0–36.0)
MCV: 92.7 fL (ref 80.0–100.0)
Platelets: 207 10*3/uL (ref 150–400)
RBC: 3.3 MIL/uL — AB (ref 4.22–5.81)
RDW: 12.9 % (ref 11.5–15.5)
WBC: 11.1 10*3/uL — ABNORMAL HIGH (ref 4.0–10.5)
nRBC: 0 % (ref 0.0–0.2)

## 2019-02-27 LAB — BASIC METABOLIC PANEL
Anion gap: 5 (ref 5–15)
BUN: 12 mg/dL (ref 6–20)
CO2: 23 mmol/L (ref 22–32)
Calcium: 7.7 mg/dL — ABNORMAL LOW (ref 8.9–10.3)
Chloride: 111 mmol/L (ref 98–111)
Creatinine, Ser: 0.84 mg/dL (ref 0.61–1.24)
GFR calc Af Amer: >60 mL/min (ref >60)
Glucose, Bld: 167 mg/dL — ABNORMAL HIGH (ref 70–99)
Potassium: 3.4 mmol/L — ABNORMAL LOW (ref 3.5–5.1)
Sodium: 139 mmol/L (ref 135–145)

## 2019-02-27 LAB — PHOSPHORUS: Phosphorus: 2.1 mg/dL — ABNORMAL LOW (ref 2.5–4.6)

## 2019-02-27 LAB — MAGNESIUM: Magnesium: 2.1 mg/dL (ref 1.7–2.4)

## 2019-02-27 MED ORDER — OXYCODONE HCL 5 MG PO TABS
5.0000 mg | ORAL_TABLET | ORAL | Status: DC | PRN
  Administered 2019-02-27 – 2019-02-28 (×4): 10 mg via ORAL
  Filled 2019-02-27 (×4): qty 2

## 2019-02-27 MED ORDER — METHOCARBAMOL 500 MG PO TABS
500.0000 mg | ORAL_TABLET | Freq: Three times a day (TID) | ORAL | Status: DC
  Administered 2019-02-27 – 2019-02-28 (×4): 500 mg via ORAL
  Filled 2019-02-27 (×4): qty 1

## 2019-02-27 MED ORDER — HYDROMORPHONE HCL 1 MG/ML IJ SOLN
1.0000 mg | INTRAMUSCULAR | Status: DC | PRN
  Administered 2019-02-27 – 2019-02-28 (×4): 1 mg via INTRAVENOUS
  Filled 2019-02-27 (×4): qty 1

## 2019-02-27 MED ORDER — ALBUTEROL SULFATE (2.5 MG/3ML) 0.083% IN NEBU: 2.5000 mg | INHALATION_SOLUTION | RESPIRATORY_TRACT | Status: DC | PRN

## 2019-02-27 MED ORDER — ENSURE ENLIVE PO LIQD
237.0000 mL | Freq: Two times a day (BID) | ORAL | Status: DC
  Administered 2019-02-28 (×2): 237 mL via ORAL

## 2019-02-27 MED ORDER — IPRATROPIUM BROMIDE 0.02 % IN SOLN
0.5000 mg | Freq: Once | RESPIRATORY_TRACT | Status: AC
  Administered 2019-02-27: 0.5 mg via RESPIRATORY_TRACT
  Filled 2019-02-27: qty 2.5

## 2019-02-27 MED ORDER — IPRATROPIUM-ALBUTEROL 0.5-2.5 (3) MG/3ML IN SOLN
3.0000 mL | Freq: Two times a day (BID) | RESPIRATORY_TRACT | Status: DC
  Administered 2019-02-28: 3 mL via RESPIRATORY_TRACT
  Filled 2019-02-27: qty 3

## 2019-02-27 MED ORDER — ACETAMINOPHEN 500 MG PO TABS
1000.0000 mg | ORAL_TABLET | Freq: Three times a day (TID) | ORAL | Status: DC
  Administered 2019-02-27 – 2019-02-28 (×3): 1000 mg via ORAL
  Filled 2019-02-27 (×3): qty 2

## 2019-02-27 MED ORDER — SACCHAROMYCES BOULARDII 250 MG PO CAPS
250.0000 mg | ORAL_CAPSULE | Freq: Two times a day (BID) | ORAL | Status: DC
  Administered 2019-02-27 – 2019-02-28 (×3): 250 mg via ORAL
  Filled 2019-02-27 (×3): qty 1

## 2019-02-27 MED ORDER — KETOROLAC TROMETHAMINE 30 MG/ML IJ SOLN
30.0000 mg | Freq: Three times a day (TID) | INTRAMUSCULAR | Status: DC | PRN
  Administered 2019-02-28: 30 mg via INTRAVENOUS
  Filled 2019-02-27: qty 1

## 2019-02-27 MED ORDER — POTASSIUM CHLORIDE CRYS ER 20 MEQ PO TBCR
20.0000 meq | EXTENDED_RELEASE_TABLET | Freq: Two times a day (BID) | ORAL | Status: DC
  Administered 2019-02-27 – 2019-02-28 (×3): 20 meq via ORAL
  Filled 2019-02-27 (×3): qty 1

## 2019-02-27 MED ORDER — ALBUTEROL SULFATE (2.5 MG/3ML) 0.083% IN NEBU
5.0000 mg | INHALATION_SOLUTION | Freq: Once | RESPIRATORY_TRACT | Status: AC
  Administered 2019-02-27: 5 mg via RESPIRATORY_TRACT
  Filled 2019-02-27: qty 6

## 2019-02-27 NOTE — Consult Note (Signed)
WOC Nurse wound consult note Reason for Consult: 1st postop change NPWT  Wound type: surgical  Pressure Injury POA: NA Measurement: 14 cms x 5 cms x 3.5 cms  Wound bed: 100% subcutaneous clean  Drainage (amount, consistency, odor) serosanguinous, moderate in vac canister  Periwound: intact  Dressing procedure/placement/frequency:   Removed old NPWT dressing Periwound skin protected with skin barrier wipe  Filled wound with 1 piece of black foam. Sealed NPWT dressing at HG  Patient received IV pain medication per bedside nurse prior to dressing change Patient tolerated procedure well.  Wife at bedside.     WOC nurse to change dressing on Friday with ostomy teaching.    WOC Nurse ostomy follow up Stoma type/location: LUQ  Output  Liquid brown  Ostomy pouching: 2pc.  Education provided:  Patient independent with emptying pouch when 1/3 to 1/2 full and how to empty Demonstrated "burping" flatus from pouch Discussed use of wick to clean spout  Discussed bathing, gas  Answered patient/family questions:  Traveling with ostomy.  CCS to address plans for travel for spring break.  Patient with concerns about scrotal edema, ordered scrotal support, bedside nurse notified.    Enrolled patient in [place] Secure Start Discharge program: Yes  Extra supplies for ostomy care in room.   Priscella Mann, RN, MSN, WOC student  Discussed POC with patient and bedside nurse.  Thanks  Brandon Wiechman [employer], RN,CWOCN, CNS, CWON-AP [phone])

## 2019-02-27 NOTE — Care Management Note (Signed)
Case Management Note  Patient Details  Name: Christopher Olson MRN: [id number] Date of Birth: [birth year]  Subjective/Objective:                    Action/Plan:  Patient will need [employer] [facility] for discharge to home in 1 to 2 days. [facility] [facility] application completed and clinicals faxed to [facility] with [facility].   Dan with [facility] updated.   Placed home health order for PA to sign. Expected Discharge Date:                  Expected Discharge Plan:  Home w Home Health Services  In-House Referral:     Discharge planning Services  CM Consult  Post Acute Care Choice:  Home Health Choice offered to:  Patient, Spouse  DME Arranged:  Vac DME Agency:  [facility]  HH Arranged:  RN HH Agency:     Status of Service:  In process, will continue to follow  If discussed at Long Length of Stay Meetings, dates discussed:    Additional Comments:  Kingsley Plan, RN 02/27/2019, 11:11 AM

## 2019-02-27 NOTE — Discharge Instructions (Addendum)
Nutrition Post Hospital Stay Proper nutrition can help your body recover from illness and injury.   Foods and beverages high in protein, vitamins, and minerals help rebuild muscle loss, promote healing, & reduce fall risk.   In addition to eating healthy foods, a nutrition shake is an easy, delicious way to get the nutrition you need during and after your hospital stay  It is recommended that you continue to drink 2 bottles per day of:       Ensure Plus for at least 1 month (30 days) after your hospital stay   Tips for adding a nutrition shake into your routine: As allowed, drink one with vitamins or medications instead of water or juice Enjoy one as a tasty mid-morning or afternoon snack Drink cold or make a milkshake out of it Drink one instead of milk with cereal or snacks Use as a coffee creamer   Available at the following grocery stores and pharmacies:           * Karin Golden * [employer] * [employer]  * [employer]          * Walmart * [employer]  * Wal[facility]reens      * Target  * [employer]J's   * [facility]  * [employer]   * [facility] [phone]            For COUPONS visit: www.[email or web address]/join or [email or web address]   Suggested Substitutions Ensure Plus = Boost Plus = Carnation Breakfast Essentials = Boost Compact Ensure Active Clear = Boost Breeze Glucerna Shake = Boost Glucose Control = Carnation Breakfast Essentials SUGAR FREE   [facility]      [facility], [place] [phone]  OPEN ABDOMINAL SURGERY: POST OP INSTRUCTIONS  Always review your discharge instruction sheet given to you by the facility where your surgery was performed.  IF YOU HAVE DISABILITY OR FAMILY LEAVE FORMS, YOU MUST BRING THEM TO THE OFFICE FOR PROCESSING.  PLEASE DO NOT GIVE THEM TO YOUR DOCTOR.  1. A prescription for pain medication may be given to you upon discharge.  Take your pain medication as prescribed, if needed.  If narcotic pain medicine is not needed, then you may take  acetaminophen (Tylenol) or ibuprofen (Advil) as needed. 2. Take your usually prescribed medications unless otherwise directed. 3. If you need a refill on your pain medication, please contact your pharmacy. They will contact our office to request authorization.  Prescriptions will not be filled after 5pm or on week-ends. 4. You should follow a light diet the first few days after arrival home, such as soup and crackers, pudding, etc.unless your doctor has advised otherwise. A high-fiber, low fat diet can be resumed as tolerated.   Be sure to include lots of fluids daily. Most patients will experience some swelling and bruising on the chest and neck area.  Ice packs will help.  Swelling and bruising can take several days to resolve 5. Most patients will experience some swelling and bruising in the area of the incision. Ice pack will help. Swelling and bruising can take several days to resolve..  6. It is common to experience some constipation if taking pain medication after surgery.  Increasing fluid intake and taking a stool softener will usually help or prevent this problem from occurring.  A mild laxative (Milk of Magnesia or Miralax) should be taken according to package directions if there are no bowel movements after 48 hours. 7.  You may have steri-strips (small skin tapes) in  place directly over the incision.  These strips should be left on the skin for 7-10 days.  If your surgeon used skin glue on the incision, you may shower in 24 hours.  The glue will flake off over the next 2-3 weeks.  Any sutures or staples will be removed at the office during your follow-up visit. You may find that a light gauze bandage over your incision may keep your staples from being rubbed or pulled. You may shower and replace the bandage daily. 8. ACTIVITIES:  You may resume regular (light) daily activities beginning the next day--such as daily self-care, walking, climbing stairs--gradually increasing activities as tolerated.   You may have sexual intercourse when it is comfortable.  Refrain from any heavy lifting or straining until approved by your doctor. a. You may drive when you no longer are taking prescription pain medication, you can comfortably wear a seatbelt, and you can safely maneuver your car and apply brakes b. Return to Work: ___________________________________ 9. You should see your doctor in the office for a follow-up appointment approximately two weeks after your surgery.  Make sure that you call for this appointment within a day or two after you arrive home to insure a convenient appointment time. OTHER INSTRUCTIONS:  _____________________________________________________________ _____________________________________________________________  WHEN TO CALL YOUR DOCTOR: 1. Fever over 101.0 2. Inability to urinate 3. Nausea and/or vomiting 4. Extreme swelling or bruising 5. Continued bleeding from incision. 6. Increased pain, redness, or drainage from the incision. 7. Difficulty swallowing or breathing 8. Muscle cramping or spasms. 9. Numbness or tingling in hands or feet or around lips.  The clinic staff is available to answer your questions during regular business hours.  Please dont hesitate to call and ask to speak to one of the nurses if you have concerns.  For further questions, please visit [email or web address]   Colostomy Home Guide, Adult  Colostomy surgery is done to create an opening in the front of the abdomen for stool (feces) to leave the body through an ostomy (stoma). Part of the large intestine is attached to the stoma. A bag, also called a pouch, is fitted over the stoma. Stool and gas will collect in the bag. After surgery, you will need to empty and change your colostomy bag as needed. You will also need to care for your stoma. How to care for the stoma Your stoma should look pink, red, and moist, like the inside of your cheek. Soon after surgery, the stoma may be  swollen, but this swelling will go away within 6 weeks. To care for the stoma:  Keep the skin around the stoma clean and dry.  Use a clean, soft washcloth to gently wash the stoma and the skin around it. Clean using a circular motion, and wipe away from the stoma opening, not toward it. ? Use warm water and only use cleansers recommended by your health care provider. ? Rinse the stoma area with plain water. ? Dry the area around the stoma well.  Use stoma powder or ointment on your skin only as told by your health care provider. Do not use any other powders, gels, wipes, or creams on the skin around the stoma.  Check the stoma area every day for signs of infection. Check for: ? New or worsening redness, swelling, or pain. ? New or increased fluid or blood. ? Pus or warmth.  Measure the stoma opening regularly and record the size. Watch for changes. (It is normal for the stoma to get smaller  as swelling goes away.) Share this information with your health care provider. How to empty the colostomy bag  Empty your bag at bedtime and whenever it is one-third to one-half full. Do not let the bag get more than half-full with stool or gas. The bag could leak if it gets too full. Some colostomy bags have a built-in gas release valve that releases gas often throughout the day. Follow these basic steps: 1. Wash your hands with soap and water. 2. Sit far back on the toilet seat. 3. Put several pieces of toilet paper into the toilet water. This will prevent splashing as you empty stool into the toilet. 4. Remove the clip or the hook-and-loop fastener from the tail end of the bag. 5. Unroll the tail, then empty the stool into the toilet. 6. Clean the tail with toilet paper or a moist towelette. 7. Reroll the tail, and close it with the clip or the hook-and-loop fastener. 8. Wash your hands again. How to change the colostomy bag Change your bag every 3-4 days or as often as told by your health care  provider. Also change the bag if it is leaking or separating from the skin, or if your skin around the stoma looks or feels irritated. Irritated skin may be a sign that the bag is leaking. Always have colostomy supplies with you, and follow these basic steps: 1. Wash your hands with soap and water. Have paper towels or tissues nearby to clean any discharge. 2. Remove the old bag and skin barrier. Use your fingers or a warm cloth to gently push the skin away from the barrier. 3. Clean the stoma area with water or with mild soap and water, as directed. Use water to rinse away any soap. 4. Dry the skin. You may use the cool setting on a hair dryer to do this. 5. Use a tracing pattern (template) to cut the skin barrier to the size needed. 6. If you are using a two-piece bag, attach the bag and the skin barrier to each other. Add the barrier ring, if you use one. 7. If directed, apply stoma powder or skin barrier gel to the skin. 8. Warm the skin barrier with your hands, or blow with a hair dryer for 5-10 seconds. 9. Remove the paper from the adhesive strip of the skin barrier. 10. Press the adhesive strip onto the skin around the stoma. 11. Gently rub the skin barrier onto the skin. This creates heat that helps the barrier to stick. 12. Apply stoma tape to the edges of the skin barrier, if desired. 13. Wash your hands again. General recommendations  Avoid wearing tight clothes or having anything press directly on your stoma or bag. Change your clothing whenever it is soiled or damp.  You may shower or bathe with the bag on or off. Do not use harsh or oily soaps or lotions. Dry the skin and bag after bathing.  Store all supplies in a cool, dry place. Do not leave supplies in extreme heat because some parts can melt or not stick as well.  Whenever you leave home, take extra clothing and an extra skin barrier and bag with you.  If your bag gets wet, you can dry it with a hair dryer on the cool  setting.  To prevent odor, you may put drops of ostomy deodorizer in the bag.  If recommended by your health care provider, put ostomy lubricant inside the bag. This helps stool to slide out of the  bag more easily and completely. Contact a health care provider if:  You have new or worsening redness, swelling, or pain around your stoma.  You have new or increased fluid or blood coming from your stoma.  Your stoma feels warm to the touch.  You have pus coming from your stoma.  Your stoma extends in or out farther than normal.  You need to change your bag every day.  You have a fever. Get help right away if:  Your stool is bloody.  You have nausea or you vomit.  You have trouble breathing. Summary  Measure your stoma opening regularly and record the size. Watch for changes.  Empty your bag at bedtime and whenever it is one-third to one-half full. Do not let the bag get more than half-full with stool or gas.  Change your bag every 3-4 days or as often as told by your health care provider.  Whenever you leave home, take extra clothing and an extra skin barrier and bag with you. This information is not intended to replace advice given to you by your health care provider. Make sure you discuss any questions you have with your health care provider. Document Released: 12/15/2003 Document Revised: 08/17/2018 Document Reviewed: 06/07/2017 Elsevier Interactive Patient Education  2019 Elsevier Inc.  Negative Pressure Wound Therapy What is negative pressure wound therapy? Negative pressure wound therapy (NPWT) is a device that helps your wounds heal. NPWT helps your wound stay clean and healthy while it heals from the inside. NPWT uses a bandage (dressing) that is made of a sponge or gauze-like material. This dressing is placed on or inside the wound. The wound is then covered and sealed with a cover dressing that sticks to your skin (adhesive). This keeps air out. A tube connects the  cover dressing to a small pump. The pump sucks fluid and germs from the wound. The pump also controls any odor coming from the wound. What are the benefits of NPWT? The benefits of NPWT may include:  Faster healing.  Lower risk of infection.  Decrease in swelling and how much fluid is in the wound.  Fewer dressing changes.  Ability to treat your wound at home.  Shorter hospital stay.  Less pain. What are the risks of NPWT? NPWT is usually safe to use. The most common problem is skin irritation from the dressing adhesive, but there are many ways to help prevent this from happening. However, more serious problems can develop, such as:  Bleeding.  Infection.  Dehydration.  Pain. What do I need to do to care for my wound?  Do not take off the dressing yourself unless told to do so by your health care provider.  Keep all follow-up visits as told by your health care provider. This is important.  Make sure you know how to change your dressing, if you will be doing this at home.  Keep the area clean and dry.  Ask your health care provider for the best way to protect your skin from becoming irritated by the adhesive. What do I need to know about the pump?  Do not turn off the pump yourself unless told to do so by your health care provider, such as for bathing.  Do not turn off the pump for more than two hours. If the pump is off for more than two hours, the dressing will need to be changed.  If your health care provider says it is okay to shower: ? Do not take the pump  into the shower. ? Make sure the wound dressing is protected and sealed. The wound area must stay dry.  Check frequently that the machine is on, that the machine indicates the therapy is on, and that all clamps are open.  If the alarm sounds: ? Stay calm. ? Do not turn off the pump or do anything with the dressing. ? Call your health care provider right away if you cannot fix the problem. The alarm may go  off because the battery is low, the dressing has a leak, or the fluid collection container is full. ? Explain to your health care provider what is happening. Follow his or her instructions. When should I seek medical care? Seek medical care if:  You have new pain.  You develop irritation, a rash, or itching around the wound or dressing.  You see new black or yellow tissue in your wound.  The dressing changes are painful or cause bleeding.  The pump has been off for more than two hours and you do not know how to change the dressing.  The pump alarm goes off and you do not know what to do. When should I seek immediate medical care? Seek immediate medical care if:  You have a lot of bleeding.  You see a sudden change in the color or texture of the drainage.  The wound breaks open.  You have severe pain.  You have signs of infection, such as: ? More redness, swelling, or pain. ? More fluid or blood. ? Warmth. ? Pus or a bad smell. ? Red streaks leading from wound. ? A fever. This information is not intended to replace advice given to you by your health care provider. Make sure you discuss any questions you have with your health care provider. Document Released: 11/24/2008 Document Revised: 01/13/2018 Document Reviewed: 09/17/2015 Elsevier Interactive Patient Education  2019 Elsevier Inc.     Managing Your Pain After Surgery Without Opioids    Thank you for participating in our program to help patients manage their pain after surgery without opioids. This is part of our effort to provide you with the best care possible, without exposing you or your family to the risk that opioids pose.  What pain can I expect after surgery? You can expect to have some pain after surgery. This is normal. The pain is typically worse the day after surgery, and quickly begins to get better. Many studies have found that many patients are able to manage their pain after surgery with  Over-the-Counter (OTC) medications such as Tylenol and Motrin. If you have a condition that does not allow you to take Tylenol or Motrin, notify your surgical team.  How will I manage my pain? The best strategy for controlling your pain after surgery is around the clock pain control with Tylenol (acetaminophen) and Motrin (ibuprofen or Advil). Alternating these medications with each other allows you to maximize your pain control. In addition to Tylenol and Motrin, you can use heating pads or ice packs on your incisions to help reduce your pain.  How will I alternate your regular strength over-the-counter pain medication? You will take a dose of pain medication every three hours. ; Start by taking 650 mg of Tylenol (2 pills of 325 mg) ; 3 hours later take 600 mg of Motrin (3 pills of 200 mg) ; 3 hours after taking the Motrin take 650 mg of Tylenol ; 3 hours after that take 600 mg of Motrin.   - 1 -  See example - if your first dose of Tylenol is at 12:00 PM   12:00 PM Tylenol 650 mg (2 pills of 325 mg)  3:00 PM Motrin 600 mg (3 pills of 200 mg)  6:00 PM Tylenol 650 mg (2 pills of 325 mg)  9:00 PM Motrin 600 mg (3 pills of 200 mg)  Continue alternating every 3 hours   We recommend that you follow this schedule around-the-clock for at least 3 days after surgery, or until you feel that it is no longer needed. Use the table on the last page of this handout to keep track of the medications you are taking. Important: Do not take more than 3000mg  of Tylenol or 3200mg  of Motrin in a 24-hour period. Do not take ibuprofen/Motrin if you have a history of bleeding stomach ulcers, severe kidney disease, &/or actively taking a blood thinner  What if I still have pain? If you have pain that is not controlled with the over-the-counter pain medications (Tylenol and Motrin or Advil) you might have what we call breakthrough pain. You will receive a prescription for a small amount of an opioid pain  medication such as Oxycodone, Tramadol, or Tylenol with Codeine. Use these opioid pills in the first 24 hours after surgery if you have breakthrough pain. Do not take more than 1 pill every 4-6 hours.  If you still have uncontrolled pain after using all opioid pills, don't hesitate to call our staff using the number provided. We will help make sure you are managing your pain in the best way possible, and if necessary, we can provide a prescription for additional pain medication.   Day 1    Time  Name of Medication Number of pills taken  Amount of Acetaminophen  Pain Level   Comments  AM PM       AM PM       AM PM       AM PM       AM PM       AM PM       AM PM       AM PM       Total Daily amount of Acetaminophen Do not take more than  3,000 mg per day      Day 2    Time  Name of Medication Number of pills taken  Amount of Acetaminophen  Pain Level   Comments  AM PM       AM PM       AM PM       AM PM       AM PM       AM PM       AM PM       AM PM       Total Daily amount of Acetaminophen Do not take more than  3,000 mg per day      Day 3    Time  Name of Medication Number of pills taken  Amount of Acetaminophen  Pain Level   Comments  AM PM       AM PM       AM PM       AM PM          AM PM       AM PM       AM PM       AM PM       Total Daily amount of Acetaminophen Do not take  more than  3,000 mg per day      Day 4    Time  Name of Medication Number of pills taken  Amount of Acetaminophen  Pain Level   Comments  AM PM       AM PM       AM PM       AM PM       AM PM       AM PM       AM PM       AM PM       Total Daily amount of Acetaminophen Do not take more than  3,000 mg per day      Day 5    Time  Name of Medication Number of pills taken  Amount of Acetaminophen  Pain Level   Comments  AM PM       AM PM       AM PM       AM PM       AM PM       AM PM       AM PM       AM PM       Total Daily amount of  Acetaminophen Do not take more than  3,000 mg per day       Day 6    Time  Name of Medication Number of pills taken  Amount of Acetaminophen  Pain Level  Comments  AM PM       AM PM       AM PM       AM PM       AM PM       AM PM       AM PM       AM PM       Total Daily amount of Acetaminophen Do not take more than  3,000 mg per day      Day 7    Time  Name of Medication Number of pills taken  Amount of Acetaminophen  Pain Level   Comments  AM PM       AM PM       AM PM       AM PM       AM PM       AM PM       AM PM       AM PM       Total Daily amount of Acetaminophen Do not take more than  3,000 mg per day        For additional information about how and where to safely dispose of unused opioid medications - [email or web address]  Disclaimer: This document contains information and/or instructional materials adapted from [facility] for the typical patient with your condition. It does not replace medical advice from your health care provider because your experience may differ from that of the typical patient. Talk to your health care provider if you have any questions about this document, your condition or your treatment plan. Adapted from [facility]

## 2019-02-27 NOTE — Progress Notes (Signed)
PROGRESS NOTE    Christopher Olson  UJW:[id number] DOB: 04-Dec-1966 DOA: 02/23/2019 PCP: [name], MD   Brief Narrative:  53 y.o.malewith medical history significant ofdiverticulitis presenting with abdominal pain.  Distention and discomfort, his work-up significant for: Obstruction at sigmoid level, seen by GI, been obstruction unable to perform colonoscopy, surgery has been consulted, went for exploratory laparotomy, with partial colectomy and end colostomy with wound VAC placement by [name] 02/24/2019   Assessment & Plan:   Principal Problem:   Colonic obstruction (HCC) Active Problems:   Hyperglycemia   Colonic obstruction -Patient with severe episodic abdominal pain with retching and hiccups  -Imaging showed high-grade colonic obstruction, likely related to sigmoid stricture in the setting of diverticular disease -Patient was quite symptomatic, NGT inserted, with minimal relief -GI input, general surgery input greatly appreciated,   -Limited role for GI at this point -General surgery input greatly appreciated, status post exploratory laparotomy, with partial colectomy and end colostomy with wound VAC placement by Dr. [name] 02/24/2019, plan to change wound VAC and ostomy bag today March 4 -on IV Zosyn, day 5, -Plan to advance to full liquids today and advance diet later if tolerates  Hyperglycemia -May be stress response, continue to monitor 110 yesterday 167 today  Leukocytosis -Likely stress demargination status post surgery, stable in the 10.1-11.1 range  Scrotal edema This is consistent with volume overload.  Will recommend scrotal sling for support  DVT prophylaxis: Lovenox SQ  Code Status: FULL    Code Status Orders  (From admission, onward)         Start     Ordered   02/23/19 0855  Full code  Continuous     02/23/19 0856        Code Status History    This patient has a current code status but no historical code status.     Family  Communication: Wife at bedside Disposition Plan:   Remains inpatient for continued treatment of postop status of colonic obstruction with wound VAC in place and ostomy in place and planned wound VAC and ostomy changed out today by general surgery Consults called: None Today Admission status: Inpatient   Consultants:   General surgery and GI  Procedures:  Dg Abd 1 View  Result Date: 02/24/2019 CLINICAL DATA:  Follow-up colonic dilatation EXAM: ABDOMEN - 1 VIEW COMPARISON:  02/23/2019 FINDINGS: Nasogastric catheter is noted within the stomach. Persistent colonic dilatation is seen. Mild improvement in the descending colon is noted although the more proximal colon remains distended. No free air is seen. No abnormal mass is noted. IMPRESSION: Slight improvement in colonic dilatation when compared with the prior exam. Electronically Signed   By: Alcide Clever M.D.   On: 02/24/2019 08:20   Dg Abd 1 View  Result Date: 02/23/2019 CLINICAL DATA:  NG Tube placement EXAM: ABDOMEN - 1 VIEW COMPARISON:  CT of the abdomen and pelvis 02/23/2011 FINDINGS: Interval placement of nasogastric tube, tip overlying the level of the stomach. Air is identified within the stomach. There is persistent significant dilatation of large bowel the descending colon measuring up to 9.7 centimeters. No plain film evidence for free intraperitoneal air. Contrast within the bladder. IMPRESSION: 1. Nasogastric tube tip overlying the level of the stomach. 2. Persistent significant dilatation of the colon. Electronically Signed   By: Norva Pavlov M.D.   On: 02/23/2019 17:22   Ct Abdomen Pelvis W Contrast  Result Date: 02/23/2019 CLINICAL DATA:  54 year old male with generalized abdominal pain and nausea. History  of sigmoid diverticulitis. EXAM: CT ABDOMEN AND PELVIS WITH CONTRAST TECHNIQUE: Multidetector CT imaging of the abdomen and pelvis was performed using the standard protocol following bolus administration of intravenous  contrast. CONTRAST:  OMNIPAQUE IOHEXOL 300 MG/ML  SOLN COMPARISON:  CT of the abdomen pelvis dated 10/04/2012 FINDINGS: Lower chest: The visualized lung bases are clear. No intra-abdominal free air or free fluid. Hepatobiliary: The liver is unremarkable. No intrahepatic biliary ductal dilatation. The gallbladder is unremarkable. Pancreas: Unremarkable. No pancreatic ductal dilatation or surrounding inflammatory changes. Spleen: Normal in size without focal abnormality. Adrenals/Urinary Tract: Adrenal glands are unremarkable. Kidneys are normal, without renal calculi, focal lesion, or hydronephrosis. Bladder is unremarkable. Stomach/Bowel: There is sigmoid diverticulosis. There is a 6 cm segmental area of thickening of the sigmoid colon (series 3 image 65 and coronal series 6, image 87) most likely muscular hypertrophy and with luminal stricture secondary to prior diverticulitis. Underlying mass is favored less likely but not entirely excluded. Follow-up with colonoscopy is recommended. There is associated luminal narrowing and high-grade obstruction with distension of stool filled colon proximal to this segment. The distended colon measures approximately 9 cm in diameter. A second area of stricture in the sigmoid colon is noted more distally. There is no evidence of small-bowel obstruction. The appendix is not visualized with certainty. No inflammatory changes identified in the right lower quadrant. Vascular/Lymphatic: The abdominal aorta and IVC are grossly unremarkable. No portal venous gas. There is no adenopathy. Reproductive: The the prostate and seminal vesicles are grossly unremarkable. Other: None Musculoskeletal: Bilateral L5 pars defects. No listhesis. No acute osseous pathology. IMPRESSION: 1. High-grade obstruction of the sigmoid colon secondary to a segmental stricture sequela of prior diverticulitis. Underlying mass is favored less likely but not entirely excluded. Clinical correlation and  follow-up with colonoscopy recommended. Diffusely dilated colon proximal to the strictures sigmoid segment measures up to 9 cm in diameter. 2. Colonic diverticulosis. Electronically Signed   By: Elgie Collard M.D.   On: 02/23/2019 06:58     Antimicrobials:  IV Zosyn day #5   Subjective: Slowly improving.  Patient has baseline anxiety.  Is improving having bowel sounds,  Objective: Vitals:   02/25/19 2059 02/26/19 0646 02/26/19 1344 02/27/19 0551  BP: 138/74 135/78 139/73 (!) 151/90  Pulse: 99 80 90 75  Resp: 18 18 16 20   Temp: 98.7 F (37.1 C) 99 F (37.2 C) 99 F (37.2 C) 99 F (37.2 C)  TempSrc: Oral Oral Oral Oral  SpO2: 94% 99% 96% 100%  Weight:      Height:        Intake/Output Summary (Last 24 hours) at 02/27/2019 1241 Last data filed at 02/27/2019 0955 Gross per 24 hour  Intake 960 ml  Output 310 ml  Net 650 ml   Filed Weights   02/23/19 0242  Weight: 92.5 kg    Examination:  General exam: Appears calm and comfortable  Respiratory system: Clear to auscultation. Respiratory effort normal. Cardiovascular system: S1 & S2 heard, RRR. No JVD, murmurs, rubs, gallops or clicks. No pedal edema. Gastrointestinal system: Abdomen is nondistended, soft and nontender. No organomegaly or masses felt. Normal bowel sounds heard. Central nervous system: Alert and oriented. No focal neurological deficits. Extremities: Symmetric 5 x 5 power. Skin: No rashes, lesions or ulcers Psychiatry: Judgement and insight appear normal. Mood & affect appropriate.     Data Reviewed: I have personally reviewed following labs and imaging studies  CBC: Recent Labs  Lab 02/23/19 0253 02/24/19 0205 02/25/19 1610  02/26/19 0126 02/27/19 0325  WBC 16.8* 16.1* 11.1* 10.1 11.1*  HGB 16.7 14.6 11.6* 9.8* 9.9*  HCT 49.7 42.0 34.9* 29.7* 30.6*  MCV 88.6 89.0 90.9 91.1 92.7  PLT 302 247 238 198 207   Basic Metabolic Panel: Recent Labs  Lab 02/23/19 0253 02/23/19 0941 02/24/19 0205  02/25/19 0223 02/26/19 0126 02/27/19 0325  NA 137  --  143 144 144 139  K 3.1*  --  3.6 4.5 3.8 3.4*  CL 107  --  114* 115* 113* 111  CO2 18*  --  19* GLUCOSE 154*  --  134* 164* 110* 167*  BUN 10  --  CREATININE 0.91  --  0.88 0.85 0.87 0.84  CALCIUM 9.2  --  8.4* 7.9* 7.7* 7.7*  MG  --  2.3  --   --   --  2.1  PHOS  --   --   --   --   --  2.1*   GFR: Estimated Creatinine Clearance: 112.1 mL/min (by C-G formula based on SCr of 0.84 mg/dL). Liver Function Tests: Recent Labs  Lab 02/23/19 0253  AST 21  ALT 18  ALKPHOS 61  BILITOT 1.3*  PROT 7.2  ALBUMIN 4.3   Recent Labs  Lab 02/23/19 0253  LIPASE 34   No results for input(s): AMMONIA in the last 168 hours. Coagulation Profile: No results for input(s): INR, PROTIME in the last 168 hours. Cardiac Enzymes: No results for input(s): CKTOTAL, CKMB, CKMBINDEX, TROPONINI in the last 168 hours. BNP (last 3 results) No results for input(s): PROBNP in the last 8760 hours. HbA1C: No results for input(s): HGBA1C in the last 72 hours. CBG: No results for input(s): GLUCAP in the last 168 hours. Lipid Profile: No results for input(s): CHOL, HDL, LDLCALC, TRIG, CHOLHDL, LDLDIRECT in the last 72 hours. Thyroid Function Tests: No results for input(s): TSH, T4TOTAL, FREET4, T3FREE, THYROIDAB in the last 72 hours. Anemia Panel: No results for input(s): VITAMINB12, FOLATE, FERRITIN, TIBC, IRON, RETICCTPCT in the last 72 hours. Sepsis Labs: No results for input(s): PROCALCITON, LATICACIDVEN in the last 168 hours.  Recent Results (from the past 240 hour(s))  Surgical pcr screen     Status: Abnormal   Collection Time: 02/24/19  9:17 AM  Result Value Ref Range Status   MRSA, PCR NEGATIVE NEGATIVE Final   Staphylococcus aureus POSITIVE (A) NEGATIVE Final    Comment: (NOTE) The Xpert SA Assay (FDA approved for NASAL specimens in patients 12 years of age and older), is one component of a  comprehensive surveillance program. It is not intended to diagnose infection nor to guide or monitor treatment. Performed at [facility], [address], [place], [place] [address]          Radiology Studies: No results found.      Scheduled Meds: . acetaminophen  1,000 mg Oral Q8H  . Chlorhexidine Gluconate Cloth  6 each Topical Q0600  . enoxaparin (LOVENOX) injection  40 mg Subcutaneous Q24H  . feeding supplement  1 Container Oral TID BM  . methocarbamol  500 mg Oral TID  . multivitamin with minerals  1 tablet Oral Daily  . mupirocin ointment  1 application Nasal BID  . potassium chloride  20 mEq Oral BID  . saccharomyces boulardii  250 mg Oral BID   Continuous Infusions: . sodium chloride Stopped (02/24/19 6045)  . chlorproMAZINE (THORAZINE) IV Stopped (02/24/19 0335)  . piperacillin-tazobactam (ZOSYN)  IV 3.375 g (02/27/19 0546)     LOS: 4 days    Time spent: 35 min    Burke Keelshristopher , MD [facility]  If 7PM-7AM, please contact night-coverage  02/27/2019, 12:41 PM

## 2019-02-27 NOTE — Progress Notes (Signed)
   02/27/19 1000  Clinical Encounter Type  Visited With Patient and family together  Visit Type Follow-up;Spiritual support;Psychological support;Social support  Spiritual Encounters  Spiritual Needs Emotional;Grief support  Stress Factors  Patient Stress Factors Major life changes;Loss of control;Financial concerns  Family Stress Factors Major life changes;Loss of control   F/u visit w/ pt, first visit w/ his spouse who is also previously known to me.  Empathetic listening to spouse, she also had difficult previous year w/ illness and death of her mother.  Celebrated w/ pt and spouse over clear pathology report and other positive news.  Christopher Olson resident, [phone]

## 2019-02-27 NOTE — Progress Notes (Addendum)
3 Days Post-Op    CC: Abdominal pain  Subjective: He is really doing pretty well.  He is up walking the halls.  Even though he is walking he is discouraged because he says he feels exhausted just walking around the hall 3 times.  His ostomy is working well.  We will plan to change the bag and the wound VAC later today.  His diet was advanced to full's but so far he is just had clears mostly Sprite.  Lungs are clear. I gave him his path report which showed diverticulosis and diverticulitis with stenosis. Objective: Vital signs in last 24 hours: Temp:  [99 F (37.2 C)] 99 F (37.2 C) (03/04 0551) Pulse Rate:  [75-90] 75 (03/04 0551) Resp:  [16-20] 20 (03/04 0551) BP: (139-151)/(73-90) 151/90 (03/04 0551) SpO2:  [96 %-100 %] 100 % (03/04 0551) Last BM Date: 02/26/19 360 PO recorded Drain 135 400 stool  Afebrile, VSS K+ 3.4 WBC 11.1 Pathology: Colon, segmental resection, Sigmoid - SEGMENT OF COLON (32 CM) SHOWING DIVERTICULOSIS AND DIVERTICULITIS WITH ASSOCIATED STENOSIS AND SEROSITIS. - BENIGN LYMPH NODES. - MARGINS APPEAR VIABLE. Intake/Output from previous day: 03/03 0701 - 03/04 0700 In: 360 [P.O.:360] Out: 610 [Drains:135; Stool:475] Intake/Output this shift: No intake/output data recorded.  General appearance: alert, cooperative and no distress Resp: clear to auscultation bilaterally GI: Soft abdomen still feels distended bowel sounds are little hyperactive.  His ostomy is working well.  Bags full of feculent material midline wound has wound VAC in place and will change that later today.  Lab Results:  Recent Labs    02/26/19 0126 02/27/19 0325  WBC 10.1 11.1*  HGB 9.8* 9.9*  HCT 29.7* 30.6*  PLT 198 207    BMET Recent Labs    02/26/19 0126 02/27/19 0325  NA 144 139  K 3.8 3.4*  CL 113* 111  CO2 23 23  GLUCOSE 110* 167*  BUN 12 12  CREATININE 0.87 0.84  CALCIUM 7.7* 7.7*   PT/INR No results for input(s): LABPROT, INR in the last 72 hours.  Recent  Labs  Lab 02/23/19 0253  AST 21  ALT 18  ALKPHOS 61  BILITOT 1.3*  PROT 7.2  ALBUMIN 4.3     Lipase     Component Value Date/Time   LIPASE 34 02/23/2019 0253     Medications: . Chlorhexidine Gluconate Cloth  6 each Topical Q0600  . enoxaparin (LOVENOX) injection  40 mg Subcutaneous Q24H  . feeding supplement  1 Container Oral TID BM  . ketorolac  30 mg Intravenous Q6H  . multivitamin with minerals  1 tablet Oral Daily  . mupirocin ointment  1 application Nasal BID   . sodium chloride Stopped (02/24/19 3888)  . sodium chloride 50 mL/hr at 02/27/19 0543  . chlorproMAZINE (THORAZINE) IV Stopped (02/24/19 0335)  . methocarbamol (ROBAXIN) IV 500 mg (02/27/19 0545)  . 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 Franky Macho Kinsinger POD #1  FEN: IV fluids/full liquids ID: Zosyn 2/29 >>day 5 DVT: SCDs/Lovenox Follow-up: Dr. [name]  Plan: We will leave him on full liquids right now because he is not really had any full liquids so far just clear liquids.  I told him to order full liquids  for breakfast and we will see how he does hopefully we can advance his diet later today.  We will make a decision about a wound VAC at home or wet-to-dry dressings later today hopefully, Case Managers already seen him and is starting to work on home health.I will  start him on some oral pain medications and work on getting him off the IV pain medicines. He is on day 5 Zosyn, will discuss length of antibiotic treatment.  I am checking knees him and replacing potassium with oral K+.  He is tolerating fulls and want to go to soft diet, so I have ordered soft  diet.   LOS: 4 days    Lyndsay Talamante 02/27/2019 [phone]

## 2019-02-27 NOTE — Progress Notes (Signed)
Nutrition Follow-up  DOCUMENTATION CODES:   Obesity unspecified  INTERVENTION:   -D/c Boost Breeze po TID, each supplement provides 250 kcal and 9 grams of protein -Ensure Enlive po BID, each supplement provides 350 kcal and 20 grams of protein -Continue MVI with minerals daily  NUTRITION DIAGNOSIS:   Increased nutrient needs related to post-op healing, wound healing as evidenced by estimated needs.  Ongoing  GOAL:   Patient will meet greater than or equal to 90% of their needs  Progressing  MONITOR:   PO intake, Supplement acceptance, Diet advancement, Labs, Weight trends, Skin, I & O's  REASON FOR ASSESSMENT:   Other (Comment)    ASSESSMENT:   Christopher Olson is a 53 y.o. male with medical history significant of diverticulitis presenting with abdominal pain.  For the last week or so, he has been having "more rumbly gas that wasn't exiting.  The last time he saw Dr. Loreta Ave and got Rifaximin for a massive floral imbalance.  He was thinking that was what it was again".  He called GI and was told to take the medication (rifaximin and Augmentin) for a second round.  He took it yesterday but started with dry heaves from 2pm yesterday.  He gets episodic horrible cramping and then heaving, up to every 7 minutes.  He was also having violent hiccups.  He is overall better right now, temporarily.  No fevers.  Last BM was yesterday AM and was normal.  Pain is diffuse and kind of moves around but is periumbilical.  3/2- s/pProcedure:exploratory laparotomy, partial colectomy with end colostomy, placement of 50cm ^2 negative pressure dressing, NGT d/c 3/3- advanced to clear liquid diet 3/4- advanced to full liquid diet, advanced to soft diet  Reviewed I/O's: -250 ml x 24 hours and +4.2 L since admission  Drain output: 135 ml x 24 hours  Colostomy output: 475 ml x 24 hours   Per general surgery notes, plan for wound vac dressing change today.   Pt advanced to full liquids after  breakfast this morning and was just advanced to soft diet for lunch. He was ambulating hallways at time of visit and good spirits. Noted open can of sprite on tray table. Noted meal completion 50%; RD will try Ensure supplements to assist with increased nutritional intake.   Labs reviewed: Phos: 2.1, K: 3.4. (on PO supplementation) Mg WDL.   Diet Order:   Diet Order            Diet full liquid Room service appropriate? Yes; Fluid consistency: Thin  Diet effective now              EDUCATION NEEDS:   Education needs have been addressed  Skin:  Skin Assessment: Skin Integrity Issues: Skin Integrity Issues:: Wound VAC Wound Vac: abdomen  Last BM:  02/26/19 (400 ml output via colostomy)  Height:   Ht Readings from Last 1 Encounters:  02/23/19 5\' 6"  (1.676 m)    Weight:   Wt Readings from Last 1 Encounters:  02/23/19 92.5 kg    Ideal Body Weight:  64.5 kg  BMI:  Body mass index is 32.93 kg/m.  Estimated Nutritional Needs:   Kcal:  1950-2150  Protein:  115-130 grams  Fluid:  > 1.9 L    Evangelynn Lochridge A. Mayford Knife, RD, LDN, CDCES Registered Dietitian II Certified Diabetes Care and Education Specialist Pager: [phone] After hours Pager: [phone]

## 2019-02-28 MED ORDER — METHOCARBAMOL 750 MG PO TABS: 750.0000 mg | ORAL_TABLET | Freq: Three times a day (TID) | ORAL | 0 refills | Status: DC

## 2019-02-28 MED ORDER — ALBUTEROL SULFATE HFA 108 (90 BASE) MCG/ACT IN AERS: 2.0000 | INHALATION_SPRAY | Freq: Four times a day (QID) | RESPIRATORY_TRACT | 2 refills | Status: AC | PRN

## 2019-02-28 MED ORDER — AMOXICILLIN-POT CLAVULANATE 875-125 MG PO TABS: 1.0000 | ORAL_TABLET | Freq: Two times a day (BID) | ORAL | 0 refills | Status: DC

## 2019-02-28 MED ORDER — OXYCODONE HCL 5 MG PO TABS: 5.0000 mg | ORAL_TABLET | ORAL | 0 refills | Status: DC | PRN

## 2019-02-28 MED ORDER — ACETAMINOPHEN 500 MG PO TABS: 1000.0000 mg | ORAL_TABLET | Freq: Four times a day (QID) | ORAL | 0 refills | Status: AC

## 2019-02-28 MED ORDER — AMOXICILLIN-POT CLAVULANATE 875-125 MG PO TABS
1.0000 | ORAL_TABLET | Freq: Two times a day (BID) | ORAL | Status: DC
  Administered 2019-02-28: 1 via ORAL
  Filled 2019-02-28: qty 1

## 2019-02-28 MED ORDER — METHOCARBAMOL 750 MG PO TABS: 750.0000 mg | ORAL_TABLET | Freq: Three times a day (TID) | ORAL | Status: DC

## 2019-02-28 MED ORDER — ACETAMINOPHEN 500 MG PO TABS: 1000.0000 mg | ORAL_TABLET | Freq: Four times a day (QID) | ORAL | Status: DC

## 2019-02-28 MED ORDER — IBUPROFEN 600 MG PO TABS
600.0000 mg | ORAL_TABLET | Freq: Three times a day (TID) | ORAL | Status: DC
  Administered 2019-02-28: 600 mg via ORAL
  Filled 2019-02-28: qty 1

## 2019-02-28 MED ORDER — IBUPROFEN 200 MG PO TABS: 600.0000 mg | ORAL_TABLET | Freq: Three times a day (TID) | ORAL | Status: DC

## 2019-02-28 NOTE — Progress Notes (Signed)
Patient discharged to home with instructions and equipments. 

## 2019-02-28 NOTE — Progress Notes (Signed)
Patient ID: Christopher Olson, male   DOB: 17-Jul-1966, 53 y.o.   MRN: [id number]    4 Days Post-Op  Subjective: States his asthma is acting up a bit.  Some pain secondary to coughing from asthma.  Otherwise tolerating a solid diet and moving his bowels into his bag.  Mobilizing well.  Objective: Vital signs in last 24 hours: Temp:  [98.7 F (37.1 C)-99.8 F (37.7 C)] 99.8 F (37.7 C) (03/05 0507) Pulse Rate:  [80-86] 83 (03/05 0507) Resp:  [18] 18 (03/05 0507) BP: (135-144)/(77-85) 140/85 (03/05 0507) SpO2:  [96 %-99 %] 96 % (03/05 0840) Last BM Date: 02/26/19  Intake/Output from previous day: 03/04 0701 - 03/05 0700 In: 850 [P.O.:830] Out: 660 [Urine:500; Drains:60; Stool:100] Intake/Output this shift: No intake/output data recorded.  PE: Heart: regular Lungs: CTAB Abd: soft, appropriately tender, midline wound VAC in place with serosang drainage, colostomy with feculent output, stoma is pink and viable, JP drain with serosang output as well.  Lab Results:  Recent Labs    02/26/19 0126 02/27/19 0325  WBC 10.1 11.1*  HGB 9.8* 9.9*  HCT 29.7* 30.6*  PLT 198 207   BMET Recent Labs    02/26/19 0126 02/27/19 0325  NA 144 139  K 3.8 3.4*  CL 113* 111  CO2 23 23  GLUCOSE 110* 167*  BUN 12 12  CREATININE 0.87 0.84  CALCIUM 7.7* 7.7*   PT/INR No results for input(s): LABPROT, INR in the last 72 hours. CMP     Component Value Date/Time   NA 139 02/27/2019 0325   K 3.4 (L) 02/27/2019 0325   CL 111 02/27/2019 0325   CO2 23 02/27/2019 0325   GLUCOSE 167 (H) 02/27/2019 0325   BUN 12 02/27/2019 0325   CREATININE 0.84 02/27/2019 0325   CALCIUM 7.7 (L) 02/27/2019 0325   PROT 7.2 02/23/2019 0253   ALBUMIN 4.3 02/23/2019 0253   AST 21 02/23/2019 0253   ALT 18 02/23/2019 0253   ALKPHOS 61 02/23/2019 0253   BILITOT 1.3 (H) 02/23/2019 0253   GFRNONAA >60 02/27/2019 0325   GFRAA >60 02/27/2019 0325   Lipase     Component Value Date/Time   LIPASE 34 02/23/2019 0253        Studies/Results: No results found.  Anti-infectives: Anti-infectives (From admission, onward)   Start     Dose/Rate Route Frequency Ordered Stop   02/28/19 1000  amoxicillin-clavulanate (AUGMENTIN) 875-125 MG per tablet 1 tablet     1 tablet Oral Every 12 hours 02/28/19 0852     02/28/19 0000  amoxicillin-clavulanate (AUGMENTIN) 875-125 MG tablet     1 tablet Oral Every 12 hours 02/28/19 0855     02/23/19 0900  piperacillin-tazobactam (ZOSYN) IVPB 3.375 g  Status:  Discontinued     3.375 g 12.5 mL/hr over 240 Minutes Intravenous Every 8 hours 02/23/19 0819 02/28/19 0852       Assessment/Plan Hx of precancerous polyps about 20 years ago/colonoscopy every 5 years last colonoscopy 2017 Hx asthma 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 [name] on 02/24/2019 -tolerating solid diet -on oral pain medication.  Will increase robaxin some and change toradol to scheduled ibuprofen to prepare for home.  Already on scheduled tylenol and oxycodone.   -wound VAC in place and being arranged for home by CM. -patient is surgically stable for DC home.  I have sent his Rx for his pain medications as well as his abx  to his pharmacy. -transition to oral augmentin and DC zosyn -will Dc JP drain prior to DC home if ok'd by medicine today.  FEN: IV fluids/regular diet ID: Zosyn 2/29 >>3/5, Augmentin 3/6 --> DVT: SCDs/Lovenox Follow-up: Dr. [name]   LOS: 5 days    Letha Cape , [facility] 02/28/2019, 8:56 AM Pager: [phone]

## 2019-02-28 NOTE — Consult Note (Signed)
WOC nurse in for ostomy teaching, noted JP drain to be draining onto gown. Discussed with surgery team. Orders to remove JP drain verified. JP drain removed without difficulty. Site covered with dry gauze and foam dressing. Foam mainly used to protect site from recent application of scrotal support.   Seri Kimmer [facility], CNS, [employer] [phone]

## 2019-02-28 NOTE — Care Management Note (Signed)
Case Management Note  Patient Details  Name: Christopher Olson MRN: [id number] Date of Birth: [birth year]  Subjective/Objective:                    Action/Plan:  [facility] VAC has been approved and released. VAC will be delivered today. Patient aware.   Left message for Dan with [facility].   Attending aware also.  Expected Discharge Date:                  Expected Discharge Plan:  Home w Home Health Services  In-House Referral:     Discharge planning Services  CM Consult  Post Acute Care Choice:  Home Health Choice offered to:  Patient, Spouse  DME Arranged:  Vac DME Agency:  [facility]  HH Arranged:  RN HH Agency:  [facility]  Status of Service:  Completed, signed off  If discussed at Long Length of Stay Meetings, dates discussed:    Additional Comments:  Kingsley Plan, RN 02/28/2019, 9:41 AM

## 2019-02-28 NOTE — Consult Note (Signed)
WOC Nurse ostomy follow up Stoma type/location: LUQ, end colostomy  Stomal assessment/size: 2" round, budded  Peristomal assessment: intact Output  100 ccs brown liquid  Ostomy pouching: 2 pc  Education provided:  Wife performed complete pouch change (cutting new skin barrier, measuring stoma, cleaning peristomal skin and stoma, use of barrier ring) Reinforced education on emptying when 1/3 to 1/2 full and how to empty, patient demonstrated emptying of pouch  Patient and wife demonstrated "burping" flatus from pouch   Discussed bathing   Enrolled patient in [place] Secure Start Discharge program: Yes  WOC nurse will follow along with you for continued support with ostomy teaching.  If patient not discharged today WOC team will plan to change NPWT dressing on Friday 03/01/2019.    This RN did assist patient to apply scrotal sling to assist with scrotal edema.   Priscella Mann, RN, MSN, WOC student Melody Marlena Clipper, WOC Nurse

## 2019-02-28 NOTE — Discharge Summary (Signed)
Physician Discharge Summary  Christopher Olson JGG:[id number] DOB: Sep 03, 1966 DOA: 02/23/2019  PCP: [name], MD  Admit date: 02/23/2019 Discharge date: 02/28/2019  Admitted From: Inpatient Disposition: home  Recommendations for Outpatient Follow-up:  1. Follow up with PCP in 1-2 weeks 2. Please follow up on the following pending results:none  Home Health:No Equipment/Devices:none  Discharge Condition:Stable CODE STATUS:Full code Diet recommendation: Regular healthy diet  Brief/Interim Summary: Per admitting provider: 52 y.o.malewith medical history significant ofdiverticulitis presenting with abdominal pain.Distention and discomfort, his work-up significant for: Obstruction at sigmoid level, seen by GI, been obstruction unable to perform colonoscopy, surgery has been consulted, went for exploratory laparotomy, with partial colectomy and end colostomy with wound VAC placement by [name] 02/24/2019  Hospital course: Colonic bowel obstruction.  Patient presented with significant abdominal pain and was found to have obstruction to sigmoid level.  An NG tube was inserted with minimal relief.  He was seen by both general surgery and GI.  He ultimately was taken to general surgery status post exploratory laparotomy with partial colectomy with end colostomy and wound VAC.  Patient was on vancomycin while in the hospital will complete a course of Augmentin on discharge.  Wound VAC is been arranged for at home.  His diet was advanced which he tolerated without complication.  Hyperglycemia.  This was consistent with stress-induced hyperglycemia, has been managed as patient continues to improve blood sugars been reasonably well controlled range from the 110s to 160s.  He will need close follow-up with his primary care physician.  Mild leukocytosis.  This is consistent with a stress induced reaction no evidence of acute infection.  Patient will complete a course of antibiotics but no  indication for any further work-up.  Has been afebrile hemodynamically stable.  Scrotal edema this consistent with volume overload postsurgically.  He was provided a scrotal sling for support.  This will resolve with ambulation and normal resumption of daily activities.  Discharge Diagnoses:  Principal Problem:   Colonic obstruction [facility]) Active Problems:   Hyperglycemia    Discharge Instructions  Discharge Instructions    Call MD for:   Complete by:  As directed    For any acute change in medical condition   Diet - low sodium heart healthy   Complete by:  As directed    Increase activity slowly   Complete by:  As directed      Allergies as of 02/28/2019      Reactions   Shellfish-derived Products Anaphylaxis   Lactose Intolerance (gi) Diarrhea, Other (See Comments)   Bloating, also      Medication List    TAKE these medications   acetaminophen 500 MG tablet Commonly known as:  TYLENOL Take 2 tablets (1,000 mg total) by mouth every 6 (six) hours.   amoxicillin-clavulanate 875-125 MG tablet Commonly known as:  AUGMENTIN Take 1 tablet by mouth every 12 (twelve) hours. What changed:    when to take this  additional instructions   ibuprofen 200 MG tablet Commonly known as:  ADVIL,MOTRIN Take 3 tablets (600 mg total) by mouth 3 (three) times daily.   LORazepam 0.5 MG tablet Commonly known as:  ATIVAN Take 0.5 mg by mouth at bedtime as needed for anxiety or sleep.   methocarbamol 750 MG tablet Commonly known as:  ROBAXIN Take 1 tablet (750 mg total) by mouth 3 (three) times daily.   oxyCODONE 5 MG immediate release tablet Commonly known as:  Oxy IR/ROXICODONE Take 1-2 tablets (5-10 mg total) by mouth every  4 (four) hours as needed for moderate pain or severe pain.   UNKNOWN TO PATIENT Take 1 capsule by mouth See admin instructions. Unnamed refrigerated probiotic: Take 1 capsule by once a day   XIFAXAN PO Take 1 tablet by mouth 2 (two) times daily.       Follow-up Information    [facility].   Specialty:  [facility]       Kinsinger, De Blanch, MD Follow up on 03/14/2019.   Specialty:  General Surgery Why:  your appointment is at 4:40 PM.  Be at the office 30 minutes early for check in.  Bring photo ID and insurance information with you.  Contact information: [address] [place] [address] [phone]          Allergies  Allergen Reactions  . Shellfish-Derived Products Anaphylaxis  . Lactose Intolerance (Gi) Diarrhea and Other (See Comments)    Bloating, also    Consultations:  gi, gen surg   Procedures/Studies: Dg Abd 1 View  Result Date: 02/24/2019 CLINICAL DATA:  Follow-up colonic dilatation EXAM: ABDOMEN - 1 VIEW COMPARISON:  02/23/2019 FINDINGS: Nasogastric catheter is noted within the stomach. Persistent colonic dilatation is seen. Mild improvement in the descending colon is noted although the more proximal colon remains distended. No free air is seen. No abnormal mass is noted. IMPRESSION: Slight improvement in colonic dilatation when compared with the prior exam. Electronically Signed   By: Alcide Clever M.D.   On: 02/24/2019 08:20   Dg Abd 1 View  Result Date: 02/23/2019 CLINICAL DATA:  NG Tube placement EXAM: ABDOMEN - 1 VIEW COMPARISON:  CT of the abdomen and pelvis 02/23/2011 FINDINGS: Interval placement of nasogastric tube, tip overlying the level of the stomach. Air is identified within the stomach. There is persistent significant dilatation of large bowel the descending colon measuring up to 9.7 centimeters. No plain film evidence for free intraperitoneal air. Contrast within the bladder. IMPRESSION: 1. Nasogastric tube tip overlying the level of the stomach. 2. Persistent significant dilatation of the colon. Electronically Signed   By: Norva Pavlov M.D.   On: 02/23/2019 17:22   Ct Abdomen Pelvis W Contrast  Result Date: 02/23/2019 CLINICAL DATA:   53 year old male with generalized abdominal pain and nausea. History of sigmoid diverticulitis. EXAM: CT ABDOMEN AND PELVIS WITH CONTRAST TECHNIQUE: Multidetector CT imaging of the abdomen and pelvis was performed using the standard protocol following bolus administration of intravenous contrast. CONTRAST:  OMNIPAQUE IOHEXOL 300 MG/ML  SOLN COMPARISON:  CT of the abdomen pelvis dated 10/04/2012 FINDINGS: Lower chest: The visualized lung bases are clear. No intra-abdominal free air or free fluid. Hepatobiliary: The liver is unremarkable. No intrahepatic biliary ductal dilatation. The gallbladder is unremarkable. Pancreas: Unremarkable. No pancreatic ductal dilatation or surrounding inflammatory changes. Spleen: Normal in size without focal abnormality. Adrenals/Urinary Tract: Adrenal glands are unremarkable. Kidneys are normal, without renal calculi, focal lesion, or hydronephrosis. Bladder is unremarkable. Stomach/Bowel: There is sigmoid diverticulosis. There is a 6 cm segmental area of thickening of the sigmoid colon (series 3 image 65 and coronal series 6, image 87) most likely muscular hypertrophy and with luminal stricture secondary to prior diverticulitis. Underlying mass is favored less likely but not entirely excluded. Follow-up with colonoscopy is recommended. There is associated luminal narrowing and high-grade obstruction with distension of stool filled colon proximal to this segment. The distended colon measures approximately 9 cm in diameter. A second area of stricture in the sigmoid  colon is noted more distally. There is no evidence of small-bowel obstruction. The appendix is not visualized with certainty. No inflammatory changes identified in the right lower quadrant. Vascular/Lymphatic: The abdominal aorta and IVC are grossly unremarkable. No portal venous gas. There is no adenopathy. Reproductive: The the prostate and seminal vesicles are grossly unremarkable. Other: None Musculoskeletal:  Bilateral L5 pars defects. No listhesis. No acute osseous pathology. IMPRESSION: 1. High-grade obstruction of the sigmoid colon secondary to a segmental stricture sequela of prior diverticulitis. Underlying mass is favored less likely but not entirely excluded. Clinical correlation and follow-up with colonoscopy recommended. Diffusely dilated colon proximal to the strictures sigmoid segment measures up to 9 cm in diameter. 2. Colonic diverticulosis. Electronically Signed   By: Elgie Collard M.D.   On: 02/23/2019 06:58       Subjective: Patient very anxious at baseline but is stable for discharge no further work-up inpatient necessary.  Case was discussed with general surgery who also think  patient is also ready for discharge.  Discharge Exam: Vitals:   02/28/19 0507 02/28/19 0840  BP: 140/85   Pulse: 83   Resp: 18   Temp: 99.8 F (37.7 C)   SpO2: 97% 96%   Vitals:   02/27/19 2109 02/27/19 2135 02/28/19 0507 02/28/19 0840  BP: 135/85  140/85   Pulse: 80  83   Resp: 18  18   Temp: 98.7 F (37.1 C)  99.8 F (37.7 C)   TempSrc: Oral  Oral   SpO2: 97% 98% 97% 96%  Weight:      Height:        General: Pt is alert, awake, not in acute distress Cardiovascular: RRR, S1/S2 +, no rubs, no gallops Respiratory: CTA bilaterally, no wheezing, no rhonchi Abdominal: Soft, NT, ND, bowel sounds + Extremities: no edema, no cyanosis    The results of significant diagnostics from this hospitalization (including imaging, microbiology, ancillary and laboratory) are listed below for reference.     Microbiology: Recent Results (from the past 240 hour(s))  Surgical pcr screen     Status: Abnormal   Collection Time: 02/24/19  9:17 AM  Result Value Ref Range Status   MRSA, PCR NEGATIVE NEGATIVE Final   Staphylococcus aureus POSITIVE (A) NEGATIVE Final    Comment: (NOTE) The Xpert SA Assay (FDA approved for NASAL specimens in patients 35 years of age and older), is one component of a  comprehensive surveillance program. It is not intended to diagnose infection nor to guide or monitor treatment. Performed at [facility], [address], [place], [place] [address]      Labs: BNP (last 3 results) No results for input(s): BNP in the last 8760 hours. Basic Metabolic Panel: Recent Labs  Lab 02/23/19 0253 02/23/19 0941 02/24/19 0205 02/25/19 0223 02/26/19 0126 02/27/19 0325  NA 137  --  143 144 144 139  K 3.1*  --  3.6 4.5 3.8 3.4*  CL 107  --  114* 115* 113* 111  CO2 18*  --  19* 24 23 23   GLUCOSE 154*  --  134* 164* 110* 167*  BUN 10  --  13 13 12 12   CREATININE 0.91  --  0.88 0.85 0.87 0.84  CALCIUM 9.2  --  8.4* 7.9* 7.7* 7.7*  MG  --  2.3  --   --   --  2.1  PHOS  --   --   --   --   --  2.1*   Liver  Function Tests: Recent Labs  Lab 02/23/19 0253  AST 21  ALT 18  ALKPHOS 61  BILITOT 1.3*  PROT 7.2  ALBUMIN 4.3   Recent Labs  Lab 02/23/19 0253  LIPASE 34   No results for input(s): AMMONIA in the last 168 hours. CBC: Recent Labs  Lab 02/23/19 0253 02/24/19 0205 02/25/19 0223 02/26/19 0126 02/27/19 0325  WBC 16.8* 16.1* 11.1* 10.1 11.1*  HGB 16.7 14.6 11.6* 9.8* 9.9*  HCT 49.7 42.0 34.9* 29.7* 30.6*  MCV 88.6 89.0 90.9 91.1 92.7  PLT 302 247 238 198 207   Cardiac Enzymes: No results for input(s): CKTOTAL, CKMB, CKMBINDEX, TROPONINI in the last 168 hours. BNP: Invalid input(s): POCBNP CBG: No results for input(s): GLUCAP in the last 168 hours. D-Dimer No results for input(s): DDIMER in the last 72 hours. Hgb A1c No results for input(s): HGBA1C in the last 72 hours. Lipid Profile No results for input(s): CHOL, HDL, LDLCALC, TRIG, CHOLHDL, LDLDIRECT in the last 72 hours. Thyroid function studies No results for input(s): TSH, T4TOTAL, T3FREE, THYROIDAB in the last 72 hours.  Invalid input(s): FREET3 Anemia work up No results for input(s): VITAMINB12, FOLATE, FERRITIN, TIBC, IRON, RETICCTPCT in the last 72  hours. Urinalysis    Component Value Date/Time   COLORURINE AMBER (A) 02/23/2019 0236   APPEARANCEUR HAZY (A) 02/23/2019 0236   LABSPEC 1.024 02/23/2019 0236   PHURINE 5.0 02/23/2019 0236   GLUCOSEU NEGATIVE 02/23/2019 0236   HGBUR NEGATIVE 02/23/2019 0236   BILIRUBINUR NEGATIVE 02/23/2019 0236   KETONESUR 80 (A) 02/23/2019 0236   PROTEINUR 30 (A) 02/23/2019 0236   NITRITE NEGATIVE 02/23/2019 0236   LEUKOCYTESUR SMALL (A) 02/23/2019 0236   Sepsis Labs Invalid input(s): PROCALCITONIN,  WBC,  LACTICIDVEN Microbiology Recent Results (from the past 240 hour(s))  Surgical pcr screen     Status: Abnormal   Collection Time: 02/24/19  9:17 AM  Result Value Ref Range Status   MRSA, PCR NEGATIVE NEGATIVE Final   Staphylococcus aureus POSITIVE (A) NEGATIVE Final    Comment: (NOTE) The Xpert SA Assay (FDA approved for NASAL specimens in patients 51 years of age and older), is one component of a comprehensive surveillance program. It is not intended to diagnose infection nor to guide or monitor treatment. Performed at [facility], [address] [place], [place] [id number]      Time coordinating discharge: Over 30 minutes  SIGNED:   Burke Keelshristopher Marcell Chavarin, MD  [facility] 02/28/2019, 11:15 AM Pager   If 7PM-7AM, please contact night-coverage [email or web address] Password [email or web address]

## 2019-03-01 ENCOUNTER — Emergency Department (HOSPITAL_COMMUNITY): Payer: BC Managed Care – PPO

## 2019-03-01 ENCOUNTER — Inpatient Hospital Stay (HOSPITAL_COMMUNITY)
Admission: EM | Admit: 2019-03-01 | Discharge: 2019-03-04 | Disposition: A | Discharge status: Home / Self Care | Payer: BC Managed Care – PPO | Source: Home / Self Care | Attending: Internal Medicine | Admitting: Internal Medicine

## 2019-03-01 ENCOUNTER — Other Ambulatory Visit: Payer: Self-pay

## 2019-03-01 ENCOUNTER — Encounter (HOSPITAL_COMMUNITY): Payer: Self-pay | Admitting: Emergency Medicine

## 2019-03-01 DIAGNOSIS — F419 Anxiety disorder, unspecified: Secondary | ICD-10-CM | POA: Diagnosis present

## 2019-03-01 DIAGNOSIS — Z9049 Acquired absence of other specified parts of digestive tract: Secondary | ICD-10-CM

## 2019-03-01 DIAGNOSIS — J9601 Acute respiratory failure with hypoxia: Secondary | ICD-10-CM

## 2019-03-01 DIAGNOSIS — J189 Pneumonia, unspecified organism: Secondary | ICD-10-CM | POA: Diagnosis present

## 2019-03-01 DIAGNOSIS — J9690 Respiratory failure, unspecified, unspecified whether with hypoxia or hypercapnia: Secondary | ICD-10-CM | POA: Diagnosis present

## 2019-03-01 LAB — URINALYSIS, ROUTINE W REFLEX MICROSCOPIC
Bilirubin Urine: NEGATIVE
Glucose, UA: NEGATIVE mg/dL
Hgb urine dipstick: NEGATIVE
Ketones, ur: NEGATIVE mg/dL
Leukocytes,Ua: NEGATIVE
Nitrite: NEGATIVE
Protein, ur: NEGATIVE mg/dL
Specific Gravity, Urine: 1.019 (ref 1.005–1.030)
pH: 8 (ref 5.0–8.0)

## 2019-03-01 LAB — CBC WITH DIFFERENTIAL/PLATELET
Abs Immature Granulocytes: 0.23 10*3/uL — ABNORMAL HIGH (ref 0.00–0.07)
Basophils Absolute: 0.1 10*3/uL (ref 0.0–0.1)
Basophils Relative: 1 %
Eosinophils Absolute: 0.4 10*3/uL (ref 0.0–0.5)
Eosinophils Relative: 4 %
HCT: 33.4 % — ABNORMAL LOW (ref 39.0–52.0)
Hemoglobin: 10.7 g/dL — ABNORMAL LOW (ref 13.0–17.0)
Immature Granulocytes: 2 %
Lymphocytes Relative: 12 %
Lymphs Abs: 1.2 10*3/uL (ref 0.7–4.0)
MCH: 30.1 pg (ref 26.0–34.0)
MCHC: 32 g/dL (ref 30.0–36.0)
MCV: 93.8 fL (ref 80.0–100.0)
Monocytes Absolute: 0.6 10*3/uL (ref 0.1–1.0)
Monocytes Relative: 6 %
Neutro Abs: 7 10*3/uL (ref 1.7–7.7)
Neutrophils Relative %: 75 %
Platelets: 287 10*3/uL (ref 150–400)
RBC: 3.56 MIL/uL — ABNORMAL LOW (ref 4.22–5.81)
RDW: 12.6 % (ref 11.5–15.5)
WBC: 9.5 10*3/uL (ref 4.0–10.5)
nRBC: 0 % (ref 0.0–0.2)

## 2019-03-01 LAB — CBC
HCT: 32.6 % — ABNORMAL LOW (ref 39.0–52.0)
Hemoglobin: 10.2 g/dL — ABNORMAL LOW (ref 13.0–17.0)
MCH: 30 pg (ref 26.0–34.0)
MCHC: 31.3 g/dL (ref 30.0–36.0)
MCV: 95.9 fL (ref 80.0–100.0)
Platelets: 243 10*3/uL (ref 150–400)
RBC: 3.4 MIL/uL — AB (ref 4.22–5.81)
RDW: 12.7 % (ref 11.5–15.5)
WBC: 9.3 10*3/uL (ref 4.0–10.5)
nRBC: 0 % (ref 0.0–0.2)

## 2019-03-01 LAB — CREATININE, SERUM
CREATININE: 0.64 mg/dL (ref 0.61–1.24)
GFR calc Af Amer: >60 mL/min (ref >60)
GFR calc non Af Amer: >60 mL/min (ref >60)

## 2019-03-01 LAB — COMPREHENSIVE METABOLIC PANEL
ALT: 70 U/L — ABNORMAL HIGH (ref 0–44)
AST: 86 U/L — ABNORMAL HIGH (ref 15–41)
Albumin: 2.7 g/dL — ABNORMAL LOW (ref 3.5–5.0)
Alkaline Phosphatase: 43 U/L (ref 38–126)
Anion gap: 8 (ref 5–15)
BUN: 7 mg/dL (ref 6–20)
CO2: 25 mmol/L (ref 22–32)
Calcium: 8.2 mg/dL — ABNORMAL LOW (ref 8.9–10.3)
Chloride: 106 mmol/L (ref 98–111)
Creatinine, Ser: 0.64 mg/dL (ref 0.61–1.24)
GFR calc Af Amer: >60 mL/min (ref >60)
GFR calc non Af Amer: >60 mL/min (ref >60)
Glucose, Bld: 109 mg/dL — ABNORMAL HIGH (ref 70–99)
Potassium: 3.7 mmol/L (ref 3.5–5.1)
Sodium: 139 mmol/L (ref 135–145)
Total Bilirubin: 1 mg/dL (ref 0.3–1.2)
Total Protein: 6.3 g/dL — ABNORMAL LOW (ref 6.5–8.1)

## 2019-03-01 LAB — TROPONIN I: Troponin I: <0.03 ng/mL (ref <0.03)

## 2019-03-01 LAB — BRAIN NATRIURETIC PEPTIDE: B Natriuretic Peptide: 107.7 pg/mL — ABNORMAL HIGH (ref 0.0–100.0)

## 2019-03-01 LAB — INFLUENZA PANEL BY PCR (TYPE A & B)
Influenza A By PCR: NEGATIVE
Influenza B By PCR: NEGATIVE

## 2019-03-01 LAB — STREP PNEUMONIAE URINARY ANTIGEN: Strep Pneumo Urinary Antigen: NEGATIVE

## 2019-03-01 LAB — LACTIC ACID, PLASMA
Lactic Acid, Venous: 1.7 mmol/L (ref 0.5–1.9)
Lactic Acid, Venous: 1 mmol/L (ref 0.5–1.9)

## 2019-03-01 MED ORDER — LORAZEPAM 2 MG/ML IJ SOLN
1.0000 mg | Freq: Once | INTRAMUSCULAR | Status: AC
  Administered 2019-03-01: 1 mg via INTRAVENOUS
  Filled 2019-03-01: qty 1

## 2019-03-01 MED ORDER — SODIUM CHLORIDE 0.9% FLUSH
3.0000 mL | Freq: Once | INTRAVENOUS | Status: AC
  Administered 2019-03-01: 3 mL via INTRAVENOUS

## 2019-03-01 MED ORDER — METHOCARBAMOL 500 MG PO TABS
750.0000 mg | ORAL_TABLET | Freq: Three times a day (TID) | ORAL | Status: DC
  Administered 2019-03-01 – 2019-03-04 (×9): 750 mg via ORAL
  Filled 2019-03-01 (×10): qty 2

## 2019-03-01 MED ORDER — SODIUM CHLORIDE 0.9 % IV SOLN
INTRAVENOUS | Status: DC
  Administered 2019-03-01 – 2019-03-04 (×5): via INTRAVENOUS

## 2019-03-01 MED ORDER — SODIUM CHLORIDE (PF) 0.9 % IJ SOLN
INTRAMUSCULAR | Status: AC
  Filled 2019-03-01: qty 50

## 2019-03-01 MED ORDER — IOHEXOL 350 MG/ML SOLN
100.0000 mL | Freq: Once | INTRAVENOUS | Status: AC | PRN
  Administered 2019-03-01: 75 mL via INTRAVENOUS

## 2019-03-01 MED ORDER — HYDROMORPHONE HCL 1 MG/ML IJ SOLN
0.5000 mg | Freq: Once | INTRAMUSCULAR | Status: AC
  Administered 2019-03-01: 0.5 mg via INTRAVENOUS
  Filled 2019-03-01: qty 1

## 2019-03-01 MED ORDER — PNEUMOCOCCAL VAC POLYVALENT 25 MCG/0.5ML IJ INJ
0.5000 mL | INJECTION | INTRAMUSCULAR | Status: AC
  Administered 2019-03-02: 0.5 mL via INTRAMUSCULAR
  Filled 2019-03-01: qty 0.5

## 2019-03-01 MED ORDER — LORAZEPAM 0.5 MG PO TABS
0.5000 mg | ORAL_TABLET | Freq: Every evening | ORAL | Status: DC | PRN
  Administered 2019-03-02 – 2019-03-03 (×2): 0.5 mg via ORAL
  Filled 2019-03-01 (×4): qty 1

## 2019-03-01 MED ORDER — SODIUM CHLORIDE 0.9 % IV SOLN
2.0000 g | Freq: Once | INTRAVENOUS | Status: AC
  Administered 2019-03-01: 2 g via INTRAVENOUS
  Filled 2019-03-01: qty 2

## 2019-03-01 MED ORDER — RISAQUAD PO CAPS
1.0000 | ORAL_CAPSULE | Freq: Every day | ORAL | Status: DC
  Administered 2019-03-01 – 2019-03-04 (×4): 1 via ORAL
  Filled 2019-03-01 (×4): qty 1

## 2019-03-01 MED ORDER — SODIUM CHLORIDE 0.9 % IV SOLN
2.0000 g | Freq: Three times a day (TID) | INTRAVENOUS | Status: DC
  Administered 2019-03-01 – 2019-03-04 (×8): 2 g via INTRAVENOUS
  Filled 2019-03-01 (×10): qty 2

## 2019-03-01 MED ORDER — ENOXAPARIN SODIUM 40 MG/0.4ML ~~LOC~~ SOLN
40.0000 mg | SUBCUTANEOUS | Status: DC
  Administered 2019-03-01 – 2019-03-03 (×3): 40 mg via SUBCUTANEOUS
  Filled 2019-03-01 (×3): qty 0.4

## 2019-03-01 MED ORDER — IBUPROFEN 200 MG PO TABS: 600.0000 mg | ORAL_TABLET | Freq: Four times a day (QID) | ORAL | Status: DC | PRN

## 2019-03-01 MED ORDER — VANCOMYCIN HCL 10 G IV SOLR
1500.0000 mg | Freq: Once | INTRAVENOUS | Status: AC
  Administered 2019-03-01: 1500 mg via INTRAVENOUS
  Filled 2019-03-01: qty 1500

## 2019-03-01 MED ORDER — VANCOMYCIN HCL IN DEXTROSE 1-5 GM/200ML-% IV SOLN
1000.0000 mg | Freq: Two times a day (BID) | INTRAVENOUS | Status: DC
  Administered 2019-03-01: 1000 mg via INTRAVENOUS
  Filled 2019-03-01: qty 200

## 2019-03-01 MED ORDER — OXYCODONE HCL 5 MG PO TABS
5.0000 mg | ORAL_TABLET | ORAL | Status: DC | PRN
  Administered 2019-03-01 – 2019-03-04 (×15): 10 mg via ORAL
  Filled 2019-03-01 (×18): qty 2

## 2019-03-01 MED ORDER — ALBUTEROL SULFATE (2.5 MG/3ML) 0.083% IN NEBU
2.5000 mg | INHALATION_SOLUTION | RESPIRATORY_TRACT | Status: DC | PRN
  Administered 2019-03-01: 2.5 mg via RESPIRATORY_TRACT
  Filled 2019-03-01 (×2): qty 3

## 2019-03-01 NOTE — H&P (Signed)
Christopher Olson is an 53 y.o. male.   Chief Complaint: Cough, shortness of breath, sweats. HPI: This patient is a 53 yr old man who was discharged from this hospital yesterday after a 6 day stay during which he underwent an exploratory laparotomy with colon resection with colostomy for obstruction at the sigmoid level. He left with a wound vac and 7 days of augmentin and flagyl. The patient states that beginning one day prior to the day of discharge he had been complaining of a cough that was productive of sputum. He was given nebulizer treatments and went home with an albuterol inhaler.   The patient states that once he got home his purulent expectoration worsened as did his shortness of breath. The patient wife states that although they did not check his temperature, the patient sweat so much in his sleep that he sweat the bed through. He returned to the ED today due to these complaints. CTA of the chest was negative for PE, but demonstrated a patchy opacity of the right lower lobe and to a lesser degree the left lower lobe.   The patient carries a past medical history significant for anxiety, diverticulitis, and meniere's disease.   In the ED the patient had a temperature of 99.1, respiratory rate of 28, and a blood pressure of 159/83. He was saturating 96% on 2L O2.    Sodium was 139, potassium was 3.7. CO2 was 25. BUN was 7. Creatinine was 0.64. LFT's were mildly elevated. WBC was 9.5, Hemoglobin was 10.7. Hematocrit was 33.4. Platelets were 287.  CXR demonstrated bilateral lower lobe patchy infiltrates. EKG demonstrated NSR with non-specific t-wave abnormalities.  The hospitalist service was consulted to admit the patient for further evaluation and treatment.  Past Medical History:  Diagnosis Date  . Anxiety   . Diverticulitis   . Meniere disease    with hearing loss    Past Surgical History:  Procedure Laterality Date  . APPENDECTOMY    . COLECTOMY  2000   for precancerous polyps  .  COLON RESECTION N/A 02/24/2019   Procedure: COLON RESECTION WITH COLOSTOMY;  Surgeon: Kinsinger, De Blanch, MD;  Location: [facility] OR;  Service: General;  Laterality: N/A;  . HERNIA REPAIR    . LAPAROTOMY N/A 02/24/2019   Procedure: EXPLORATORY LAPAROTOMY;  Surgeon: [name] De Blanch, MD;  Location[facility] OR;  Service: General;  Laterality: N/A;    Family History  Problem Relation Age of Onset  . Irritable bowel syndrome Mother    Social History:  reports that he has never smoked. He has never used smokeless tobacco. He reports current alcohol use of about 2.0 standard drinks of alcohol per week. He reports that he does not use drugs. Medications Prior to Admission  Medication Sig Dispense Refill  . acetaminophen (TYLENOL) 500 MG tablet Take 2 tablets (1,000 mg total) by mouth every 6 (six) hours. (Patient taking differently: Take 1,000 mg by mouth every 4 (four) hours as needed for moderate pain or headache. ) 30 tablet 0  . albuterol (PROVENTIL HFA;VENTOLIN HFA) 108 (90 Base) MCG/ACT inhaler Inhale 2 puffs into the lungs every 6 (six) hours as needed for wheezing or shortness of breath. 1 Inhaler 2  . amoxicillin-clavulanate (AUGMENTIN) 875-125 MG tablet Take 1 tablet by mouth every 12 (twelve) hours. (Patient taking differently: Take 1 tablet by mouth every 12 (twelve) hours. Started 03/05 for 7 days) 14 tablet 0  . ibuprofen (ADVIL,MOTRIN) 200 MG tablet Take 3 tablets (600 mg total) by mouth  3 (three) times daily. (Patient taking differently: Take 600 mg by mouth every 6 (six) hours as needed for headache, mild pain, moderate pain or cramping. )    . LORazepam (ATIVAN) 0.5 MG tablet Take 0.5 mg by mouth at bedtime as needed for anxiety or sleep.     . methocarbamol (ROBAXIN) 750 MG tablet Take 1 tablet (750 mg total) by mouth 3 (three) times daily. 30 tablet 0  . oxyCODONE (OXY IR/ROXICODONE) 5 MG immediate release tablet Take 1-2 tablets (5-10 mg total) by mouth every 4 (four) hours as needed for  moderate pain or severe pain. 30 tablet 0  . Probiotic Product (PROBIOTIC PO) Take 1 capsule by mouth daily. Unknown name of medication but is kept in refrigerator      Allergies:  Allergies  Allergen Reactions  . Shellfish-Derived Products Anaphylaxis  . Lactose Intolerance (Gi) Diarrhea and Other (See Comments)    Bloating, also    Pertinent items are noted in HPI.   General appearance: alert, cooperative and mild distress Head: Normocephalic, without obvious abnormality, atraumatic Eyes: conjunctivae/corneas clear. PERRL, EOM's intact. Fundi benign. Throat: lips, mucosa, and tongue normal; teeth and gums normal Neck: no adenopathy, no carotid bruit, no JVD, supple, symmetrical, trachea midline and thyroid not enlarged, symmetric, no tenderness/mass/nodules Resp: Positive for increased work of breathing. Positive for rales at bases bilaterally and scatered rhonchi throughout. No wheezes. No tactile fremitus. Chest wall: no tenderness Cardio: regular rate and rhythm, S1, S2 normal, no murmur, click, rub or gallop GI: soft, non-tender; bowel sounds normal; no masses,  no organomegaly Extremities: extremities normal, atraumatic, no cyanosis or edema Pulses: 2+ and symmetric Skin: Skin color, texture, turgor normal. No rashes or lesions Lymph nodes: Cervical, supraclavicular, and axillary nodes normal. Neurologic: Grossly normal Wound vac over incision site was not disturbed.  Results for orders placed or performed during the hospital encounter of 03/01/19 (from the past 48 hour(s))  Lactic acid, plasma     Status: None   Collection Time: 03/01/19 12:02 PM  Result Value Ref Range   Lactic Acid, Venous 1.7 0.5 - 1.9 mmol/L    Comment: Performed at [facility], [address], [place], [place] [address]  Comprehensive metabolic panel     Status: Abnormal   Collection Time: 03/01/19 12:09 PM  Result Value Ref Range   Sodium 139 135 - 145 mmol/L   Potassium 3.7  3.5 - 5.1 mmol/L   Chloride 106 98 - 111 mmol/L   CO2 25 22 - 32 mmol/L   Glucose, Bld 109 (H) 70 - 99 mg/dL   BUN 7 6 - 20 mg/dL   Creatinine, Ser 5.46 0.61 - 1.24 mg/dL   Calcium 8.2 (L) 8.9 - 10.3 mg/dL   Total Protein 6.3 (L) 6.5 - 8.1 g/dL   Albumin 2.7 (L) 3.5 - 5.0 g/dL   AST 86 (H) 15 - 41 U/L   ALT 70 (H) 0 - 44 U/L   Alkaline Phosphatase 43 38 - 126 U/L   Total Bilirubin 1.0 0.3 - 1.2 mg/dL   GFR calc non Af Amer >60 >60 mL/min   GFR calc Af Amer >60 >60 mL/min   Anion gap 8 5 - 15    Comment: Performed at [facility], [address], [place], [place] [address]  CBC with Differential     Status: Abnormal   Collection Time: 03/01/19 12:09 PM  Result Value Ref Range   WBC 9.5 4.0 - 10.5 K/uL  RBC 3.56 (L) 4.22 - 5.81 MIL/uL   Hemoglobin 10.7 (L) 13.0 - 17.0 g/dL   HCT 11.9 (L) 14.7 - 82.9 %   MCV 93.8 80.0 - 100.0 fL   MCH 30.1 26.0 - 34.0 pg   MCHC 32.0 30.0 - 36.0 g/dL   RDW 56.2 13.0 - 86.5 %   Platelets 287 150 - 400 K/uL   nRBC 0.0 0.0 - 0.2 %   Neutrophils Relative % 75 %   Neutro Abs 7.0 1.7 - 7.7 K/uL   Lymphocytes Relative 12 %   Lymphs Abs 1.2 0.7 - 4.0 K/uL   Monocytes Relative 6 %   Monocytes Absolute 0.6 0.1 - 1.0 K/uL   Eosinophils Relative 4 %   Eosinophils Absolute 0.4 0.0 - 0.5 K/uL   Basophils Relative 1 %   Basophils Absolute 0.1 0.0 - 0.1 K/uL   Immature Granulocytes 2 %   Abs Immature Granulocytes 0.23 (H) 0.00 - 0.07 K/uL    Comment: Performed at [facility], [address], [place], [place] [address]  Brain natriuretic peptide     Status: Abnormal   Collection Time: 03/01/19 12:09 PM  Result Value Ref Range   B Natriuretic Peptide 107.7 (H) 0.0 - 100.0 pg/mL    Comment: Performed at [facility], [address] [place], [place] [address]  Troponin I - Add-On to previous collection     Status: None   Collection Time: 03/01/19 12:09 PM  Result Value Ref Range   Troponin I <0.03  <0.03 ng/mL    Comment: Performed at [facility], [address], [place], [place] [address]  Urinalysis, Routine w reflex microscopic     Status: None   Collection Time: 03/01/19 12:21 PM  Result Value Ref Range   Color, Urine YELLOW YELLOW   APPearance CLEAR CLEAR   Specific Gravity, Urine 1.019 1.005 - 1.030   pH 8.0 5.0 - 8.0   Glucose, UA NEGATIVE NEGATIVE mg/dL   Hgb urine dipstick NEGATIVE NEGATIVE   Bilirubin Urine NEGATIVE NEGATIVE   Ketones, ur NEGATIVE NEGATIVE mg/dL   Protein, ur NEGATIVE NEGATIVE mg/dL   Nitrite NEGATIVE NEGATIVE   Leukocytes,Ua NEGATIVE NEGATIVE    Comment: Performed at [facility], [address], [place], [place] [address]  Lactic acid, plasma     Status: None   Collection Time: 03/01/19  1:36 PM  Result Value Ref Range   Lactic Acid, Venous 1.0 0.5 - 1.9 mmol/L    Comment: Performed at [facility], [address], [place], [place] [address]   @  Blood pressure (!) 159/94, pulse 80, temperature 99.6 F (37.6 C), temperature source Oral, resp. rate (!) 26, height  (1.676 m), weight 92.5 kg, SpO2 99 %.  Assessment/Plan Problem  Hcap (Healthcare-Associated Pneumonia)  Anxiety  S/P Partial Colectomy  Respiratory Failure (Hcc)   The patient will be admitted to a med/surg bed. He will receive IV antibiotics, nebulizer treatments, and mucolytics. Blood cultures x 2 will be obtained. Wound care will be consulted to care for his wound. He will be continued on his home medications as possible. Hi oxygen will be weaned as possible.  I have seen and examined this patient myself. I have spent 70 minutes in his evaluation and admission. I have viewed and interpreted his EKG and CXR myself.  Christopher Olson 03/01/2019, 4:57 PM

## 2019-03-01 NOTE — ED Notes (Signed)
Patient transported to CT 

## 2019-03-01 NOTE — ED Triage Notes (Signed)
Pt's pain r/t surgery, has taken oxycodone this morning at 0930.

## 2019-03-01 NOTE — ED Notes (Signed)
ED TO INPATIENT HANDOFF REPORT  ED Nurse Name and Phone #: Karma Ganja RN [phone]  S Name/Age/Gender Christopher Olson 53 y.o. male Room/Bed: WA09/WA09  Code Status   Code Status: Full Code  Home/SNF/Other Home Patient oriented to: self, place, time and situation Is this baseline? Yes   Triage Complete: Triage complete  Chief Complaint Severe Shortness of Breath   Triage Note Pt c/o shortness of breath that has been worsening and became severe today. Pt states he was d/c yesterday and O2 sats decreased this morning with [facility] RN. In triage, pt with tachypnea and shallow breathing, sats 97%. Pt reports hx of asthma, has used inhaler this morning. Pt appears anxious in triage and once calm he is able to speak in complete sentences without difficulty. Non productive cough.  Pt's pain r/t surgery, has taken oxycodone this morning at 0930.   Allergies Allergies  Allergen Reactions  . Shellfish-Derived Products Anaphylaxis  . Lactose Intolerance (Gi) Diarrhea and Other (See Comments)    Bloating, also    Level of Care/Admitting Diagnosis ED Disposition    ED Disposition Condition Comment   Admit  Hospital Area: [facility] [[id number]]  Level of Care: Med-Surg [16]  Diagnosis: HCAP (healthcare-associated pneumonia) [454098]  Admitting Physician: Fran Lowes [id number]  Attending Physician: Gerri Lins, AVA [email or web address]  Estimated length of stay: 3 - 4 days  Certification:: I certify this patient will need inpatient services for at least 2 midnights  PT Class (Do Not Modify): Inpatient [101]  PT Acc Code (Do Not Modify): Private [1]       B Medical/Surgery History Past Medical History:  Diagnosis Date  . Anxiety   . Diverticulitis   . Meniere disease    with hearing loss   Past Surgical History:  Procedure Laterality Date  . APPENDECTOMY    . COLECTOMY  2000   for precancerous polyps  . COLON RESECTION N/A 02/24/2019   Procedure: COLON RESECTION WITH COLOSTOMY;  Surgeon:  Kinsinger, De Blanch, MD;  Location: [facility] OR;  Service: General;  Laterality: N/A;  . HERNIA REPAIR    . LAPAROTOMY N/A 02/24/2019   Procedure: EXPLORATORY LAPAROTOMY;  Surgeon: Kinsinger, De Blanch, MD;  Location: [facility] OR;  Service: General;  Laterality: N/A;     A IV Location/Drains/Wounds Patient Lines/Drains/Airways Status   Active Line/Drains/Airways    Name:   Placement date:   Placement time:   Site:   Days:   Peripheral IV 03/01/19 Right Antecubital   03/01/19    1201    Antecubital   less than 1   Closed System Drain 1 RLQ Bulb (JP) 19 Fr.   02/24/19    1225    RLQ   5   Negative Pressure Wound Therapy Abdomen Medial   02/24/19    1239    -   5   Colostomy LUQ   02/24/19    -    LUQ   5   Incision (Closed) 02/24/19 Abdomen   02/24/19    1320     5          Intake/Output Last 24 hours No intake or output data in the 24 hours ending 03/01/19 1606  Labs/Imaging Results for orders placed or performed during the hospital encounter of 03/01/19 (from the past 48 hour(s))  Lactic acid, plasma     Status: None   Collection Time: 03/01/19 12:02 PM  Result Value Ref Range   Lactic Acid, Venous 1.7 0.5 - 1.9 mmol/L  Comment: Performed at [facility], [address] [place], [place] [address]  Comprehensive metabolic panel     Status: Abnormal   Collection Time: 03/01/19 12:09 PM  Result Value Ref Range   Sodium 139 135 - 145 mmol/L   Potassium 3.7 3.5 - 5.1 mmol/L   Chloride 106 98 - 111 mmol/L   CO2 25 22 - 32 mmol/L   Glucose, Bld 109 (H) 70 - 99 mg/dL   BUN 7 6 - 20 mg/dL   Creatinine, Ser 4.58 0.61 - 1.24 mg/dL   Calcium 8.2 (L) 8.9 - 10.3 mg/dL   Total Protein 6.3 (L) 6.5 - 8.1 g/dL   Albumin 2.7 (L) 3.5 - 5.0 g/dL   AST 86 (H) 15 - 41 U/L   ALT 70 (H) 0 - 44 U/L   Alkaline Phosphatase 43 38 - 126 U/L   Total Bilirubin 1.0 0.3 - 1.2 mg/dL   GFR calc non Af Amer >60 >60 mL/min   GFR calc Af Amer >60 >60 mL/min   Anion gap 8 5 - 15    Comment:  Performed at [facility], [address] [place], [place] [address]  CBC with Differential     Status: Abnormal   Collection Time: 03/01/19 12:09 PM  Result Value Ref Range   WBC 9.5 4.0 - 10.5 K/uL   RBC 3.56 (L) 4.22 - 5.81 MIL/uL   Hemoglobin 10.7 (L) 13.0 - 17.0 g/dL   HCT 38.2 (L) 50.5 - 39.7 %   MCV 93.8 80.0 - 100.0 fL   MCH 30.1 26.0 - 34.0 pg   MCHC 32.0 30.0 - 36.0 g/dL   RDW 67.3 41.9 - 37.9 %   Platelets 287 150 - 400 K/uL   nRBC 0.0 0.0 - 0.2 %   Neutrophils Relative % 75 %   Neutro Abs 7.0 1.7 - 7.7 K/uL   Lymphocytes Relative 12 %   Lymphs Abs 1.2 0.7 - 4.0 K/uL   Monocytes Relative 6 %   Monocytes Absolute 0.6 0.1 - 1.0 K/uL   Eosinophils Relative 4 %   Eosinophils Absolute 0.4 0.0 - 0.5 K/uL   Basophils Relative 1 %   Basophils Absolute 0.1 0.0 - 0.1 K/uL   Immature Granulocytes 2 %   Abs Immature Granulocytes 0.23 (H) 0.00 - 0.07 K/uL    Comment: Performed at [facility], [address] [place], [place] [address]  Brain natriuretic peptide     Status: Abnormal   Collection Time: 03/01/19 12:09 PM  Result Value Ref Range   B Natriuretic Peptide 107.7 (H) 0.0 - 100.0 pg/mL    Comment: Performed at [facility], [address] [place], [place] [address]  Troponin I - Add-On to previous collection     Status: None   Collection Time: 03/01/19 12:09 PM  Result Value Ref Range   Troponin I <0.03 <0.03 ng/mL    Comment: Performed at [facility], [address] [place], [place] [address]  Urinalysis, Routine w reflex microscopic     Status: None   Collection Time: 03/01/19 12:21 PM  Result Value Ref Range   Color, Urine YELLOW YELLOW   APPearance CLEAR CLEAR   Specific Gravity, Urine 1.019 1.005 - 1.030   pH 8.0 5.0 - 8.0   Glucose, UA NEGATIVE NEGATIVE mg/dL   Hgb urine dipstick NEGATIVE NEGATIVE   Bilirubin Urine NEGATIVE NEGATIVE   Ketones, ur NEGATIVE NEGATIVE mg/dL  Protein, ur NEGATIVE NEGATIVE mg/dL   Nitrite NEGATIVE NEGATIVE   Leukocytes,Ua NEGATIVE NEGATIVE    Comment: Performed at [facility], [address], [place], [place] [address]  Lactic acid, plasma     Status: None   Collection Time: 03/01/19  1:36 PM  Result Value Ref Range   Lactic Acid, Venous 1.0 0.5 - 1.9 mmol/L    Comment: Performed at [facility], [address], [place], [place] [address]   Dg Chest 2 View  Result Date: 03/01/2019 CLINICAL DATA:  Short of breath. EXAM: CHEST - 2 VIEW COMPARISON:  None. FINDINGS: The heart size and mediastinal contours are within normal limits. Both lungs are clear. The visualized skeletal structures are unremarkable. IMPRESSION: No active cardiopulmonary disease. Electronically Signed   By: Signa Kell M.D.   On: 03/01/2019 12:11   Ct Angio Chest Pe W And/or Wo Contrast  Result Date: 03/01/2019 CLINICAL DATA:  Chest pain. EXAM: CT ANGIOGRAPHY CHEST WITH CONTRAST TECHNIQUE: Multidetector CT imaging of the chest was performed using the standard protocol during bolus administration of intravenous contrast. Multiplanar CT image reconstructions and MIPs were obtained to evaluate the vascular anatomy. CONTRAST:  103mL OMNIPAQUE IOHEXOL 350 MG/ML SOLN COMPARISON:  Chest x-ray February 28, 2019 FINDINGS: Cardiovascular: No definite focal filling defect is identified in the main pulmonary and segmental pulmonary arteries of bilateral upper lobes. Evaluation of subsegmental and segmental pulmonary arteries of the bilateral mid to lower lungs are limited due to respiratory motion. The heart size is normal. There is no pericardial effusion. Mediastinum/Nodes: No enlarged mediastinal, hilar, or axillary lymph nodes. Thyroid gland, trachea, and esophagus demonstrate no significant findings. Lungs/Pleura: Scarring of right apex is noted. Patchy opacity of the right lower lobe and to a lesser degree left lower lobe are identified. There  is no pleural effusion. Upper Abdomen: No acute abnormality. Musculoskeletal: No acute abnormality. Review of the MIP images confirms the above findings. IMPRESSION: No definite filling defect identified to suggest pulmonary embolus in the main pulmonary arteries. Valuation of the segmental subsegmental pulmonary arteries of the mid to lower lungs are limited due to respiratory motion. Patchy opacity of the right lower lobe and to a lesser degree left lower lobe, developing pneumonias are not excluded. Electronically Signed   By: Sherian Rein M.D.   On: 03/01/2019 13:49    Pending Labs Unresulted Labs (From admission, onward)    Start     Ordered   03/08/19 0500  Creatinine, serum  (enoxaparin (LOVENOX)    CrCl >/= 30 ml/min)  Weekly,   R    Comments:  while on enoxaparin therapy    03/01/19 1519   03/02/19 0500  Comprehensive metabolic panel  Tomorrow morning,   R     03/01/19 1519   03/02/19 0500  CBC WITH DIFFERENTIAL  Tomorrow morning,   R     03/01/19 1519   03/01/19 1519  Adenovirus antibodies  Once,   R     03/01/19 1519   03/01/19 1519  Legionella Pneumophila Serogp 1 Ur Ag  Once,   R     03/01/19 1519   03/01/19 1518  Respiratory Panel by PCR  (Respiratory virus panel with precautions)  Once,   R     03/01/19 1519   03/01/19 1518  Influenza panel by PCR (type A & B)  (Influenza PCR Panel)  Once,   R     03/01/19 1519   03/01/19 1516  Culture, blood (routine  x 2) Call MD if unable to obtain prior to antibiotics being given  BLOOD CULTURE X 2,   R    Comments:  If blood cultures drawn in Emergency Department - Do not draw and cancel order    03/01/19 1519   03/01/19 1516  Culture, sputum-assessment  Once,   R     03/01/19 1519   03/01/19 1516  Gram stain  Once,   R     03/01/19 1519   03/01/19 1516  HIV antibody (Routine Screening)  Once,   R     03/01/19 1519   03/01/19 1516  Strep pneumoniae urinary antigen  Once,   R     03/01/19 1519   03/01/19 1516  CBC  (enoxaparin  (LOVENOX)    CrCl >/= 30 ml/min)  Once,   R    Comments:  Baseline for enoxaparin therapy IF NOT ALREADY DRAWN.  Notify MD if PLT < 100 K.    03/01/19 1519   03/01/19 1516  Creatinine, serum  (enoxaparin (LOVENOX)    CrCl >/= 30 ml/min)  Once,   R    Comments:  Baseline for enoxaparin therapy IF NOT ALREADY DRAWN.    03/01/19 1519          Vitals/Pain Today's Vitals   03/01/19 1400 03/01/19 1504 03/01/19 1530 03/01/19 1600  BP: (!) 154/90 (!) 142/85 (!) 161/82 (!) 168/90  Pulse: 73 86 87 87  Resp: (!) 21 (!) 22 (!) 22 (!) 24  Temp:      TempSrc:      SpO2: 98% 99% 96% 98%  Weight:      Height:      PainSc:        Isolation Precautions Droplet precaution  Medications Medications  vancomycin (VANCOCIN) 1,500 mg in sodium chloride 0.9 % 500 mL IVPB (1,500 mg Intravenous New Bag/Given 03/01/19 1511)  enoxaparin (LOVENOX) injection 40 mg (has no administration in time range)  0.9 %  sodium chloride infusion (has no administration in time range)  ceFEPIme (MAXIPIME) 1 g in sodium chloride 0.9 % 100 mL IVPB (has no administration in time range)  albuterol (PROVENTIL) (2.5 MG/3ML) 0.083% nebulizer solution 2.5 mg (has no administration in time range)  ibuprofen (ADVIL,MOTRIN) tablet 600 mg (has no administration in time range)  oxyCODONE (Oxy IR/ROXICODONE) immediate release tablet 5-10 mg (has no administration in time range)  LORazepam (ATIVAN) tablet 0.5 mg (has no administration in time range)  Probiotic CAPS (has no administration in time range)  methocarbamol (ROBAXIN) tablet 750 mg (has no administration in time range)  vancomycin (VANCOCIN) IVPB 1000 mg/200 mL premix (has no administration in time range)  sodium chloride flush (NS) 0.9 % injection 3 mL (3 mLs Intravenous Given 03/01/19 1203)  iohexol (OMNIPAQUE) 350 MG/ML injection 100 mL (75 mLs Intravenous Contrast Given 03/01/19 1318)  ceFEPIme (MAXIPIME) 2 g in sodium chloride 0.9 % 100 mL IVPB (0 g Intravenous Stopped  03/01/19 1504)  LORazepam (ATIVAN) injection 1 mg (1 mg Intravenous Given 03/01/19 1507)  HYDROmorphone (DILAUDID) injection 0.5 mg (0.5 mg Intravenous Given 03/01/19 1507)    Mobility walks Low fall risk   Focused Assessments Pulmonary Assessment Handoff:  Lung sounds: Bilateral Breath Sounds: Clear L Breath Sounds: Clear R Breath Sounds: Clear O2 Device: Room Air        R Recommendations: See Admitting Provider Note  Report given to:   Additional Notes:

## 2019-03-01 NOTE — ED Triage Notes (Addendum)
Pt c/o shortness of breath that has been worsening and became severe today. Pt states he was d/c yesterday and O2 sats decreased this morning with [facility] RN. In triage, pt with tachypnea and shallow breathing, sats 97%. Pt reports hx of asthma, has used inhaler this morning. Pt appears anxious in triage and once calm he is able to speak in complete sentences without difficulty. Non productive cough.

## 2019-03-01 NOTE — ED Notes (Signed)
Patient transported to X-ray 

## 2019-03-01 NOTE — ED Provider Notes (Signed)
[facility]-EMERGENCY DEPT Provider Note   CSN: [id number] Arrival date & time: 03/01/19  1124    History   Chief Complaint Chief Complaint  Patient presents with  . Shortness of Breath    HPI Christopher Olson is a 53 y.o. male.     HPI   53 year old male with shortness of breath.  Patient was discharged in the hospital yesterday after admission for colonic obstruction and had surgery.  He states that he was feeling mildly short of breath during the hospitalization.  It was worsening since discharge.  Occasional nonproductive cough.  No fevers.  Denies any acute chest pain.  No unusual swelling.  Abdominal pain is been stable.  Tolerating p.o.  Past Medical History:  Diagnosis Date  . Anxiety   . Diverticulitis   . Meniere disease    with hearing loss    Patient Active Problem List   Diagnosis Date Noted  . Colonic obstruction (HCC) 02/23/2019  . Hyperglycemia 02/23/2019    Past Surgical History:  Procedure Laterality Date  . APPENDECTOMY    . COLECTOMY  2000   for precancerous polyps  . COLON RESECTION N/A 02/24/2019   Procedure: COLON RESECTION WITH COLOSTOMY;  Surgeon: Kinsinger, De Blanch, MD;  Location: [facility] OR;  Service: General;  Laterality: N/A;  . HERNIA REPAIR    . LAPAROTOMY N/A 02/24/2019   Procedure: EXPLORATORY LAPAROTOMY;  Surgeon: [name] De Blanch, MD;  Location: [facility] OR;  Service: General;  Laterality: N/A;      Home Medications    Prior to Admission medications   Medication Sig Start Date End Date Taking? Authorizing Provider  acetaminophen (TYLENOL) 500 MG tablet Take 2 tablets (1,000 mg total) by mouth every 6 (six) hours. Patient taking differently: Take 1,000 mg by mouth every 4 (four) hours as needed for moderate pain or headache.  02/28/19  Yes Barnetta Chapel, PA-C  albuterol (PROVENTIL HFA;VENTOLIN HFA) 108 (90 Base) MCG/ACT inhaler Inhale 2 puffs into the lungs every 6 (six) hours as needed for wheezing or shortness of  breath. 02/28/19  Yes Spongberg, Susy Frizzle, MD  amoxicillin-clavulanate (AUGMENTIN) 875-125 MG tablet Take 1 tablet by mouth every 12 (twelve) hours. Patient taking differently: Take 1 tablet by mouth every 12 (twelve) hours. Started 03/05 for 7 days 02/28/19  Yes Barnetta Chapel, PA-C  ibuprofen (ADVIL,MOTRIN) 200 MG tablet Take 3 tablets (600 mg total) by mouth 3 (three) times daily. Patient taking differently: Take 600 mg by mouth every 6 (six) hours as needed for headache, mild pain, moderate pain or cramping.  02/28/19  Yes Barnetta Chapel, PA-C  LORazepam (ATIVAN) 0.5 MG tablet Take 0.5 mg by mouth at bedtime as needed for anxiety or sleep.  02/02/19  Yes [provider]  methocarbamol (ROBAXIN) 750 MG tablet Take 1 tablet (750 mg total) by mouth 3 (three) times daily. 02/28/19  Yes Barnetta Chapel, PA-C  oxyCODONE (OXY IR/ROXICODONE) 5 MG immediate release tablet Take 1-2 tablets (5-10 mg total) by mouth every 4 (four) hours as needed for moderate pain or severe pain. 02/28/19  Yes Barnetta Chapel, PA-C  Probiotic Product (PROBIOTIC PO) Take 1 capsule by mouth daily. Unknown name of medication but is kept in refrigerator   Yes [provider]    Family History Family History  Problem Relation Age of Onset  . Irritable bowel syndrome Mother     Social History Social History   Tobacco Use  . Smoking status: Never Smoker  . Smokeless tobacco: Never Used  Substance Use Topics  . Alcohol use: Yes    Alcohol/week: 2.0 standard drinks    Types: 2 Shots of liquor per week  . Drug use: Never     Allergies   Shellfish-derived products and Lactose intolerance (gi)   Review of Systems Review of Systems  All systems reviewed and negative, other than as noted in HPI.  Physical Exam Updated Vital Signs BP (!) 154/90   Pulse 73   Temp 99.1 F (37.3 C) (Oral)   Resp (!) 21   Ht 5\' 6"  (1.676 m)   Wt 92.5 kg   SpO2 98%   BMI 32.93 kg/m   Physical Exam Vitals signs and  nursing note reviewed.  Constitutional:      General: He is not in acute distress.    Appearance: He is well-developed.  HENT:     Head: Normocephalic and atraumatic.  Eyes:     General:        Right eye: No discharge.        Left eye: No discharge.     Conjunctiva/sclera: Conjunctivae normal.  Neck:     Musculoskeletal: Neck supple.  Cardiovascular:     Rate and Rhythm: Normal rate and regular rhythm.     Heart sounds: Normal heart sounds. No murmur. No friction rub. No gallop.   Pulmonary:     Effort: No respiratory distress.     Breath sounds: Normal breath sounds.     Comments: Tachypnea Abdominal:     Palpations: Abdomen is soft.     Comments: Abdominal wounds appear to be okay.  Midline VAC.  Scant serous drainage at the site of recent drain to the right lower quadrant.  Seems to have appropriate tenderness given the timing of his recent procedure.  Musculoskeletal:        General: No tenderness.  Skin:    General: Skin is warm and dry.  Neurological:     Mental Status: He is alert.  Psychiatric:        Mood and Affect: Mood is anxious.        Behavior: Behavior normal.        Thought Content: Thought content normal.      ED Treatments / Results  Labs (all labs ordered are listed, but only abnormal results are displayed) Labs Reviewed  COMPREHENSIVE METABOLIC PANEL - Abnormal; Notable for the following components:      Result Value   Glucose, Bld 109 (*)    Calcium 8.2 (*)    Total Protein 6.3 (*)    Albumin 2.7 (*)    AST 86 (*)    ALT 70 (*)    All other components within normal limits  CBC WITH DIFFERENTIAL/PLATELET - Abnormal; Notable for the following components:   RBC 3.56 (*)    Hemoglobin 10.7 (*)    HCT 33.4 (*)    Abs Immature Granulocytes 0.23 (*)    All other components within normal limits  BRAIN NATRIURETIC PEPTIDE - Abnormal; Notable for the following components:   B Natriuretic Peptide 107.7 (*)    All other components within normal  limits  LACTIC ACID, PLASMA  LACTIC ACID, PLASMA  TROPONIN I  URINALYSIS, ROUTINE W REFLEX MICROSCOPIC    EKG EKG Interpretation  Date/Time:  Friday March 01 2019 11:33:04 EST Ventricular Rate:  96 PR Interval:    QRS Duration: 94 QT Interval:  368 QTC Calculation: 465 R Axis:   87 Text Interpretation:  Sinus rhythm Borderline T wave  abnormalities Confirmed by Raeford Razor [phone]) on 03/01/2019 1:25:26 PM   Radiology Dg Chest 2 View  Result Date: 03/01/2019 CLINICAL DATA:  Short of breath. EXAM: CHEST - 2 VIEW COMPARISON:  None. FINDINGS: The heart size and mediastinal contours are within normal limits. Both lungs are clear. The visualized skeletal structures are unremarkable. IMPRESSION: No active cardiopulmonary disease. Electronically Signed   By: Signa Kell M.D.   On: 03/01/2019 12:11   Ct Angio Chest Pe W And/or Wo Contrast  Result Date: 03/01/2019 CLINICAL DATA:  Chest pain. EXAM: CT ANGIOGRAPHY CHEST WITH CONTRAST TECHNIQUE: Multidetector CT imaging of the chest was performed using the standard protocol during bolus administration of intravenous contrast. Multiplanar CT image reconstructions and MIPs were obtained to evaluate the vascular anatomy. CONTRAST:  75mL OMNIPAQUE IOHEXOL 350 MG/ML SOLN COMPARISON:  Chest x-ray February 28, 2019 FINDINGS: Cardiovascular: No definite focal filling defect is identified in the main pulmonary and segmental pulmonary arteries of bilateral upper lobes. Evaluation of subsegmental and segmental pulmonary arteries of the bilateral mid to lower lungs are limited due to respiratory motion. The heart size is normal. There is no pericardial effusion. Mediastinum/Nodes: No enlarged mediastinal, hilar, or axillary lymph nodes. Thyroid gland, trachea, and esophagus demonstrate no significant findings. Lungs/Pleura: Scarring of right apex is noted. Patchy opacity of the right lower lobe and to a lesser degree left lower lobe are identified. There is no  pleural effusion. Upper Abdomen: No acute abnormality. Musculoskeletal: No acute abnormality. Review of the MIP images confirms the above findings. IMPRESSION: No definite filling defect identified to suggest pulmonary embolus in the main pulmonary arteries. Valuation of the segmental subsegmental pulmonary arteries of the mid to lower lungs are limited due to respiratory motion. Patchy opacity of the right lower lobe and to a lesser degree left lower lobe, developing pneumonias are not excluded. Electronically Signed   By: Sherian Rein M.D.   On: 03/01/2019 13:49    Procedures Procedures (including critical care time)  Medications Ordered in ED Medications  ceFEPIme (MAXIPIME) 2 g in sodium chloride 0.9 % 100 mL IVPB (has no administration in time range)  vancomycin (VANCOCIN) 1,500 mg in sodium chloride 0.9 % 500 mL IVPB (has no administration in time range)  sodium chloride flush (NS) 0.9 % injection 3 mL (3 mLs Intravenous Given 03/01/19 1203)  iohexol (OMNIPAQUE) 350 MG/ML injection 100 mL (75 mLs Intravenous Contrast Given 03/01/19 1318)     Initial Impression / Assessment and Plan / ED Course  I have reviewed the triage vital signs and the nursing notes.  Pertinent labs & imaging results that were available during my care of the patient were reviewed by me and considered in my medical decision making (see chart for details).  52yM with cough and dyspnea. No large PE noted but possible pneumonia. Pt very tachypneic. He also appears pretty anxious. He undoubtedly feels bad but I'm not sure how much of his symptoms are from pulmonary process versus anxiety.  Regardless, antibiotics ordered.  Given that he still remains pretty tachypneic will admit for IV antibiotics.  Final Clinical Impressions(s) / ED Diagnoses   Final diagnoses:  HCAP (healthcare-associated pneumonia)    ED Discharge Orders    None       Raeford Razor, MD 03/08/19 [phone]

## 2019-03-01 NOTE — Progress Notes (Addendum)
Pharmacy Antibiotic Note  Christopher Olson is a 53 y.o. male admitted on 03/01/2019 with pneumonia.  He was admitted to [facility] 2/29>>3/5 for bowel obstruction and has been on course of  Zosyn>>Augmentin.  Pharmacy has been consulted for Vancomycin dosing. Vancomycin 1500mg  loading dose given in ED.  03/01/2019:  Tm 99.93F  WBC, LA WNL  Scr at baseline, est CrCl>119ml/min  Chest CT: developing PNA of RLL  Plan: Cefepime per MD Vancomycin 1000mg  IV q12h (target AUC 400-550) Monitor renal function and cx data   Height: 5\' 6"  (167.6 cm) Weight: 204 lb (92.5 kg) IBW/kg (Calculated) : 63.8  Temp (24hrs), Avg:99.1 F (37.3 C), Min:99.1 F (37.3 C), Max:99.1 F (37.3 C)  Recent Labs  Lab 02/24/19 0205 02/25/19 0223 02/26/19 0126 02/27/19 0325 03/01/19 1202 03/01/19 1209 03/01/19 1336  WBC 16.1* 11.1* 10.1 11.1*  --  9.5  --   CREATININE 0.88 0.85 0.87 0.84  --  0.64  --   LATICACIDVEN  --   --   --   --  1.7  --  1.0    Estimated Creatinine Clearance: 117.7 mL/min (by C-G formula based on SCr of 0.64 mg/dL).    Allergies  Allergen Reactions  . Shellfish-Derived Products Anaphylaxis  . Lactose Intolerance (Gi) Diarrhea and Other (See Comments)    Bloating, also    Antimicrobials this admission: 3/6 Vancomycin >>  3/6 Cefepime >>  2/29 Zosyn>>3/5 3/5 Augmentin>>3/6  Dose adjustments this admission:  Microbiology results: BCx:  Sputum Cx: Influenza PCR: Resp PCR: 3/1 MRSA PCR: negative  Thank you for allowing pharmacy to be a part of this patient's care.  Elson Clan 03/01/2019 3:20 PM

## 2019-03-02 LAB — RESPIRATORY PANEL BY PCR
Adenovirus: NOT DETECTED
Bordetella pertussis: NOT DETECTED
Chlamydophila pneumoniae: NOT DETECTED
Coronavirus 229E: NOT DETECTED
Coronavirus HKU1: NOT DETECTED
Coronavirus NL63: NOT DETECTED
Coronavirus OC43: DETECTED — AB
Influenza A: NOT DETECTED
Influenza B: NOT DETECTED
Metapneumovirus: NOT DETECTED
Mycoplasma pneumoniae: NOT DETECTED
PARAINFLUENZA VIRUS 2-RVPPCR: NOT DETECTED
PARAINFLUENZA VIRUS 3-RVPPCR: NOT DETECTED
Parainfluenza Virus 1: NOT DETECTED
Parainfluenza Virus 4: NOT DETECTED
RHINOVIRUS / ENTEROVIRUS - RVPPCR: NOT DETECTED
Respiratory Syncytial Virus: NOT DETECTED

## 2019-03-02 LAB — CBC WITH DIFFERENTIAL/PLATELET
Abs Immature Granulocytes: 0.26 10*3/uL — ABNORMAL HIGH (ref 0.00–0.07)
BASOS PCT: 1 %
Basophils Absolute: 0.1 10*3/uL (ref 0.0–0.1)
Eosinophils Absolute: 0.4 10*3/uL (ref 0.0–0.5)
Eosinophils Relative: 4 %
HCT: 31.9 % — ABNORMAL LOW (ref 39.0–52.0)
Hemoglobin: 10.1 g/dL — ABNORMAL LOW (ref 13.0–17.0)
Immature Granulocytes: 3 %
Lymphocytes Relative: 15 %
Lymphs Abs: 1.5 10*3/uL (ref 0.7–4.0)
MCH: 30.1 pg (ref 26.0–34.0)
MCHC: 31.7 g/dL (ref 30.0–36.0)
MCV: 94.9 fL (ref 80.0–100.0)
Monocytes Absolute: 0.9 10*3/uL (ref 0.1–1.0)
Monocytes Relative: 9 %
Neutro Abs: 6.9 10*3/uL (ref 1.7–7.7)
Neutrophils Relative %: 68 %
Platelets: 276 10*3/uL (ref 150–400)
RBC: 3.36 MIL/uL — ABNORMAL LOW (ref 4.22–5.81)
RDW: 12.7 % (ref 11.5–15.5)
WBC: 10.1 10*3/uL (ref 4.0–10.5)
nRBC: 0 % (ref 0.0–0.2)

## 2019-03-02 LAB — COMPREHENSIVE METABOLIC PANEL
ALT: 83 U/L — ABNORMAL HIGH (ref 0–44)
AST: 75 U/L — ABNORMAL HIGH (ref 15–41)
Albumin: 2.6 g/dL — ABNORMAL LOW (ref 3.5–5.0)
Alkaline Phosphatase: 41 U/L (ref 38–126)
Anion gap: 8 (ref 5–15)
BUN: 6 mg/dL (ref 6–20)
CO2: 26 mmol/L (ref 22–32)
Calcium: 8.1 mg/dL — ABNORMAL LOW (ref 8.9–10.3)
Chloride: 104 mmol/L (ref 98–111)
Creatinine, Ser: 0.69 mg/dL (ref 0.61–1.24)
GFR calc Af Amer: >60 mL/min (ref >60)
GFR calc non Af Amer: >60 mL/min (ref >60)
Glucose, Bld: 112 mg/dL — ABNORMAL HIGH (ref 70–99)
Potassium: 3.6 mmol/L (ref 3.5–5.1)
Sodium: 138 mmol/L (ref 135–145)
Total Bilirubin: 0.6 mg/dL (ref 0.3–1.2)
Total Protein: 5.9 g/dL — ABNORMAL LOW (ref 6.5–8.1)

## 2019-03-02 LAB — LEGIONELLA PNEUMOPHILA SEROGP 1 UR AG: L. pneumophila Serogp 1 Ur Ag: NEGATIVE

## 2019-03-02 LAB — HIV ANTIBODY (ROUTINE TESTING W REFLEX): HIV Screen 4th Generation wRfx: NONREACTIVE

## 2019-03-02 MED ORDER — SODIUM CHLORIDE 3 % IN NEBU
4.0000 mL | INHALATION_SOLUTION | Freq: Two times a day (BID) | RESPIRATORY_TRACT | Status: DC
  Administered 2019-03-02: 15 mL via RESPIRATORY_TRACT
  Administered 2019-03-03 – 2019-03-04 (×2): 4 mL via RESPIRATORY_TRACT
  Filled 2019-03-02 (×4): qty 4

## 2019-03-02 MED ORDER — IPRATROPIUM-ALBUTEROL 0.5-2.5 (3) MG/3ML IN SOLN
3.0000 mL | Freq: Three times a day (TID) | RESPIRATORY_TRACT | Status: DC
  Administered 2019-03-02 – 2019-03-04 (×4): 3 mL via RESPIRATORY_TRACT
  Filled 2019-03-02 (×3): qty 3
  Filled 2019-03-02: qty 30
  Filled 2019-03-02: qty 3

## 2019-03-02 MED ORDER — GUAIFENESIN ER 600 MG PO TB12
1200.0000 mg | ORAL_TABLET | Freq: Two times a day (BID) | ORAL | Status: DC
  Administered 2019-03-02 – 2019-03-04 (×4): 1200 mg via ORAL
  Filled 2019-03-02 (×4): qty 2

## 2019-03-02 MED ORDER — IPRATROPIUM-ALBUTEROL 0.5-2.5 (3) MG/3ML IN SOLN: 3.0000 mL | Freq: Three times a day (TID) | RESPIRATORY_TRACT | Status: DC

## 2019-03-02 MED ORDER — METHYLPREDNISOLONE SODIUM SUCC 125 MG IJ SOLR
60.0000 mg | Freq: Three times a day (TID) | INTRAMUSCULAR | Status: DC
  Administered 2019-03-02 – 2019-03-04 (×7): 60 mg via INTRAVENOUS
  Filled 2019-03-02 (×7): qty 2

## 2019-03-02 NOTE — Progress Notes (Addendum)
PROGRESS NOTE  Christopher Olson UJW:[id number] DOB: 1966/07/06 DOA: 03/01/2019 PCP: [name], MD  HPI/Recap of past 24 hours: This patient is a 53 yr old man who was discharged from this hospital on 02/28/2019 after a 6 day stay during which he underwent an exploratory laparotomy with colon resection with colostomy for obstruction at the sigmoid level. He left with a wound vac and 7 days of augmentin and flagyl. The patient states that beginning one day prior to the day of discharge he had been complaining of a cough that was productive of sputum. He was given nebulizer treatments and went home with an albuterol inhaler.  He returned to the ED due to these complaints. CTA of the chest was negative for PE, but demonstrated a patchy opacity of the right lower lobe and to a lesser degree the left lower lobe.   CXR demonstrated bilateral lower lobe patchy infiltrates. TRH asked to admit.  03/02/19: Patient seen and examined at his bedside.  No acute events overnight.  States he is wheezing and still having difficulty with breathing.  On O2 supplement 2 L and saturating 100%.  Audible wheezing noted.  Added IV steroids Solu-Medrol and placed on continuous oximetry.  Denies chest pain or palpitations.  Assessment/Plan: Active Problems:   HCAP (healthcare-associated pneumonia)   Anxiety   S/P partial colectomy   Respiratory failure (HCC)  HCAP, poa Presented with productive cough, hypoxia, tachypnea and imaging indicative of pneumonia. Independently reviewed CT a of the chest and chest x-ray done on admission which revealed bilateral lower lobes infiltrate versus atelectasis Continue IV antibiotics IV cefepime MRSA PCR negative on 02/24/2019, DC IV vancomycin Continue nebs Pulmonary toilet Add Solu-Medrol 60 mg 3 times daily for wheezing Maintain O2 saturation greater than 92%  Coronavirus infection, poa Respiratory panel does not test for the novel coronavirus 2019 Continue supportive  care Continue droplet precautions Maintain O2 saturation greater than 92% Add continuous pulse oximetry  Acute hypoxic respiratory failure secondary to above: Coronavirus infection OC43 and HCAP, poa Management as stated above Not on O2 supplementation at baseline Home O2 evaluation when close to discharge  Bowel obstruction status post exploratory laparotomy with partial colectomy with end colostomy and wound VAC placement by Dr. Ellouise Newer on 02/24/2019. Wound care specialist to manage wound VAC No apparent acute issues  Moderate protein calorie malnutrition Albumin 2.6 Encouraged to increase oral protein intake Add oral supplement    Code Status: Full code  Family Communication: None at bedside  Disposition Plan: Home when clinically stable   Consultants:  None  Procedures:  None  Antimicrobials:  IV cefepime  DVT prophylaxis: Subcu Lovenox daily   Objective: Vitals:   03/01/19 1600 03/01/19 1639 03/01/19 2018 03/02/19 0536  BP: (!) 168/90 (!) 159/94 133/85 (!) 150/89  Pulse: 87 80 86 70  Resp: (!) 24 (!) 26 20 (!) 21  Temp:  99.6 F (37.6 C) 99.1 F (37.3 C) 98 F (36.7 C)  TempSrc:  Oral Oral Oral  SpO2: 98% 99% 97% 100%  Weight:      Height:        Intake/Output Summary (Last 24 hours) at 03/02/2019 5621 Last data filed at 03/02/2019 3086 Gross per 24 hour  Intake 1333.97 ml  Output 1200 ml  Net 133.97 ml   Filed Weights   03/01/19 1134  Weight: 92.5 kg    Exam:  . General: 53 y.o. year-old male well developed well nourished in no acute distress.  Alert and oriented  x3. . Cardiovascular: Regular rate and rhythm with no rubs or gallops.  No thyromegaly or JVD noted.   Marland Kitchen Respiratory: Diffused wheezing bilaterally with poor inspiratory effort. . Abdomen: Soft nontender nondistended with normal bowel sounds x4 quadrants. . Musculoskeletal: Trace lower extremity edema. 2/4 pulses in all 4 extremities. . Skin: No ulcerative lesions noted or  rashes, . Psychiatry: Mood is appropriate for condition and setting   Data Reviewed: CBC: Recent Labs  Lab 02/26/19 0126 02/27/19 0325 03/01/19 1209 03/01/19 1653 03/02/19 0532  WBC 10.1 11.1* 9.5 9.3 10.1  NEUTROABS  --   --  7.0  --  6.9  HGB 9.8* 9.9* 10.7* 10.2* 10.1*  HCT 29.7* 30.6* 33.4* 32.6* 31.9*  MCV 91.1 92.7 93.8 95.9 94.9  PLT 198 207 287 243 276   Basic Metabolic Panel: Recent Labs  Lab 02/23/19 0941  02/25/19 0223 02/26/19 0126 02/27/19 0325 03/01/19 1209 03/01/19 1653 03/02/19 0532  NA  --    < > 144 144 139 139  --  138  K  --    < > 4.5 3.8 3.4* 3.7  --  3.6  CL  --    < > 115* 113* 111 106  --  104  CO2  --    < > 24 23 23 25   --  26  GLUCOSE  --    < > 164* 110* 167* 109*  --  112*  BUN  --    < > 13 12 12 7   --  6  CREATININE  --    < > 0.85 0.87 0.84 0.64 0.64 0.69  CALCIUM  --    < > 7.9* 7.7* 7.7* 8.2*  --  8.1*  MG 2.3  --   --   --  2.1  --   --   --   PHOS  --   --   --   --  2.1*  --   --   --    < > = values in this interval not displayed.   GFR: Estimated Creatinine Clearance: 117.7 mL/min (by C-G formula based on SCr of 0.69 mg/dL). Liver Function Tests: Recent Labs  Lab 03/01/19 1209 03/02/19 0532  AST 86* 75*  ALT 70* 83*  ALKPHOS 43 41  BILITOT 1.0 0.6  PROT 6.3* 5.9*  ALBUMIN 2.7* 2.6*   No results for input(s): LIPASE, AMYLASE in the last 168 hours. No results for input(s): AMMONIA in the last 168 hours. Coagulation Profile: No results for input(s): INR, PROTIME in the last 168 hours. Cardiac Enzymes: Recent Labs  Lab 03/01/19 1209  TROPONINI <0.03   BNP (last 3 results) No results for input(s): PROBNP in the last 8760 hours. HbA1C: No results for input(s): HGBA1C in the last 72 hours. CBG: No results for input(s): GLUCAP in the last 168 hours. Lipid Profile: No results for input(s): CHOL, HDL, LDLCALC, TRIG, CHOLHDL, LDLDIRECT in the last 72 hours. Thyroid Function Tests: No results for input(s): TSH,  T4TOTAL, FREET4, T3FREE, THYROIDAB in the last 72 hours. Anemia Panel: No results for input(s): VITAMINB12, FOLATE, FERRITIN, TIBC, IRON, RETICCTPCT in the last 72 hours. Urine analysis:    Component Value Date/Time   COLORURINE YELLOW 03/01/2019 1221   APPEARANCEUR CLEAR 03/01/2019 1221   LABSPEC 1.019 03/01/2019 1221   PHURINE 8.0 03/01/2019 1221   GLUCOSEU NEGATIVE 03/01/2019 1221   HGBUR NEGATIVE 03/01/2019 1221   BILIRUBINUR NEGATIVE 03/01/2019 1221   KETONESUR NEGATIVE 03/01/2019 1221   PROTEINUR  NEGATIVE 03/01/2019 1221   NITRITE NEGATIVE 03/01/2019 1221   LEUKOCYTESUR NEGATIVE 03/01/2019 1221   Sepsis Labs: (procalcitonin:4,lacticidven:4)  ) Recent Results (from the past 240 hour(s))  Surgical pcr screen     Status: Abnormal   Collection Time: 02/24/19  9:17 AM  Result Value Ref Range Status   MRSA, PCR NEGATIVE NEGATIVE Final   Staphylococcus aureus POSITIVE (A) NEGATIVE Final    Comment: (NOTE) The Xpert SA Assay (FDA approved for NASAL specimens in patients 58 years of age and older), is one component of a comprehensive surveillance program. It is not intended to diagnose infection nor to guide or monitor treatment. Performed at [facility], [address] [place], [place] [address]   Respiratory Panel by PCR     Status: Abnormal   Collection Time: 03/01/19  4:48 PM  Result Value Ref Range Status   Adenovirus NOT DETECTED NOT DETECTED Final   Coronavirus 229E NOT DETECTED NOT DETECTED Final    Comment: (NOTE) The Coronavirus on the Respiratory Panel, DOES NOT test for the novel  Coronavirus (2019 nCoV)    Coronavirus HKU1 NOT DETECTED NOT DETECTED Final   Coronavirus NL63 NOT DETECTED NOT DETECTED Final   Coronavirus OC43 DETECTED (A) NOT DETECTED Final   Metapneumovirus NOT DETECTED NOT DETECTED Final   Rhinovirus / Enterovirus NOT DETECTED NOT DETECTED Final   Influenza A NOT DETECTED NOT DETECTED Final   Influenza B NOT DETECTED NOT  DETECTED Final   Parainfluenza Virus 1 NOT DETECTED NOT DETECTED Final   Parainfluenza Virus 2 NOT DETECTED NOT DETECTED Final   Parainfluenza Virus 3 NOT DETECTED NOT DETECTED Final   Parainfluenza Virus 4 NOT DETECTED NOT DETECTED Final   Respiratory Syncytial Virus NOT DETECTED NOT DETECTED Final   Bordetella pertussis NOT DETECTED NOT DETECTED Final   Chlamydophila pneumoniae NOT DETECTED NOT DETECTED Final   Mycoplasma pneumoniae NOT DETECTED NOT DETECTED Final    Comment: Performed at [facility], [address] [place], [place] [address]      Studies: Dg Chest 2 View  Result Date: 03/01/2019 CLINICAL DATA:  Short of breath. EXAM: CHEST - 2 VIEW COMPARISON:  None. FINDINGS: The heart size and mediastinal contours are within normal limits. Both lungs are clear. The visualized skeletal structures are unremarkable. IMPRESSION: No active cardiopulmonary disease. Electronically Signed   By: Signa Kell M.D.   On: 03/01/2019 12:11   Ct Angio Chest Pe W And/or Wo Contrast  Result Date: 03/01/2019 CLINICAL DATA:  Chest pain. EXAM: CT ANGIOGRAPHY CHEST WITH CONTRAST TECHNIQUE: Multidetector CT imaging of the chest was performed using the standard protocol during bolus administration of intravenous contrast. Multiplanar CT image reconstructions and MIPs were obtained to evaluate the vascular anatomy. CONTRAST:  75mL OMNIPAQUE IOHEXOL 350 MG/ML SOLN COMPARISON:  Chest x-ray February 28, 2019 FINDINGS: Cardiovascular: No definite focal filling defect is identified in the main pulmonary and segmental pulmonary arteries of bilateral upper lobes. Evaluation of subsegmental and segmental pulmonary arteries of the bilateral mid to lower lungs are limited due to respiratory motion. The heart size is normal. There is no pericardial effusion. Mediastinum/Nodes: No enlarged mediastinal, hilar, or axillary lymph nodes. Thyroid gland, trachea, and esophagus demonstrate no significant findings. Lungs/Pleura:  Scarring of right apex is noted. Patchy opacity of the right lower lobe and to a lesser degree left lower lobe are identified. There is no pleural effusion. Upper Abdomen: No acute abnormality. Musculoskeletal: No acute abnormality. Review of the MIP images  confirms the above findings. IMPRESSION: No definite filling defect identified to suggest pulmonary embolus in the main pulmonary arteries. Valuation of the segmental subsegmental pulmonary arteries of the mid to lower lungs are limited due to respiratory motion. Patchy opacity of the right lower lobe and to a lesser degree left lower lobe, developing pneumonias are not excluded. Electronically Signed   By: Sherian Rein M.D.   On: 03/01/2019 13:49    Scheduled Meds: . acidophilus  1 capsule Oral Daily  . enoxaparin (LOVENOX) injection  40 mg Subcutaneous Q24H  . methocarbamol  750 mg Oral TID  . methylPREDNISolone (SOLU-MEDROL) injection  60 mg Intravenous Q8H  . pneumococcal 23 valent vaccine  0.5 mL Intramuscular Tomorrow-1000    Continuous Infusions: . sodium chloride 75 mL/hr at 03/02/19 0557  . ceFEPime (MAXIPIME) IV 2 g (03/02/19 0558)  . vancomycin Stopped (03/01/19 2231)     LOS: 1 day     Darlin Drop, MD [facility] Pager [phone]  If 7PM-7AM, please contact night-coverage [email or web address] Password [email or web address] 03/02/2019, 8:07 AM

## 2019-03-03 MED ORDER — AMLODIPINE BESYLATE 5 MG PO TABS
5.0000 mg | ORAL_TABLET | Freq: Every day | ORAL | Status: DC
  Administered 2019-03-03 – 2019-03-04 (×2): 5 mg via ORAL
  Filled 2019-03-03 (×2): qty 1

## 2019-03-03 MED ORDER — LORAZEPAM 0.5 MG PO TABS
0.5000 mg | ORAL_TABLET | Freq: Once | ORAL | Status: AC
  Administered 2019-03-03: 0.5 mg via ORAL

## 2019-03-03 MED ORDER — ENSURE ENLIVE PO LIQD
237.0000 mL | Freq: Two times a day (BID) | ORAL | Status: DC
  Administered 2019-03-03: 237 mL via ORAL

## 2019-03-03 MED ORDER — HALOPERIDOL LACTATE 5 MG/ML IJ SOLN: 2.0000 mg | Freq: Once | INTRAMUSCULAR | Status: DC

## 2019-03-03 NOTE — Progress Notes (Signed)
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 He left with a wound vac and 7 days of augmentin and flagyl. The patient states that beginning one day prior to the day of discharge he had been complaining of a cough that was productive of sputum. He was given nebulizer treatments and went home with an albuterol inhaler.  He returned to the ED due to these complaints. CTA of the chest was negative for PE, but demonstrated a patchy opacity of the right lower lobe and to a lesser degree the left lower lobe.   CXR demonstrated bilateral lower lobe patchy infiltrates. TRH asked to admit.  03/02/19: States he is wheezing and still having difficulty with breathing.  On O2 supplement 2 L and saturating 100%.  Audible wheezing noted.  Added IV steroids Solu-Medrol and placed on continuous oximetry.  Denies chest pain or palpitations.  03/03/19: Seen and examined at bedside.  No acute events overnight.  States his breathing and cough are improving.  Denies chest pain or palpitation.  Has no new complaints.  Assessment/Plan: Active Problems:   HCAP (healthcare-associated pneumonia)   Anxiety   S/P partial colectomy   Respiratory failure (HCC)  HCAP, poa Presented with productive cough, hypoxia, tachypnea and imaging indicative of pneumonia. Independently reviewed CT a of the chest and chest x-ray done on admission which revealed bilateral lower lobes infiltrate versus atelectasis Continue IV antibiotics IV cefepime day #2 MRSA PCR negative on 02/24/2019, DC IV vancomycin Continue nebs Pulmonary toilet Continue Solu-Medrol 60 mg 3 times daily for wheezing Maintain O2 saturation greater than  92%  Coronavirus infection, poa Respiratory panel does not test for the novel coronavirus 2019 Continue supportive care Continue droplet precautions Maintain O2 saturation greater than 92% Add continuous pulse oximetry  Acute hypoxic respiratory failure secondary to above: Coronavirus infection OC43 and HCAP, poa Management as stated above Not on O2 supplementation at baseline Obtain home O2 evaluation tomorrow 03/04/2019 for discharge planning  Uncontrolled hypertension Blood pressure persistently elevated Not on antihypertensive medications at home Started on amlodipine 5 mg daily on 03/03/2019 Goal normotensive blood pressure Continue to monitor vital signs  Bowel obstruction status post exploratory laparotomy with partial colectomy with end colostomy and wound VAC placement by Dr. Ellouise Newer on 02/24/2019. Wound care specialist to manage wound VAC No apparent acute issues Pain medications as needed  Moderate protein calorie malnutrition Albumin 2.6 Encouraged to increase oral protein intake Add oral supplement    Code Status: Full code  Family Communication: None at bedside  Disposition Plan: Home possibly tomorrow 03/04/2019 if passes home O2 evaluation.   Consultants:  None  Procedures:  None  Antimicrobials:  IV cefepime  DVT prophylaxis: Subcu Lovenox daily   Objective: Vitals:   03/02/19 2038 03/02/19 2104 03/03/19 0511 03/03/19 0825  BP: 138/76  (!) 151/84   Pulse: 76  68   Resp: 19  18   Temp: 98.5 F (36.9 C)  97.7 F (36.5 C)   TempSrc: Oral  Oral   SpO2: 95% 94% 100% 98%  Weight:      Height:        Intake/Output Summary (Last 24 hours) at 03/03/2019 1023 Last data filed at  03/03/2019 0357 Gross per 24 hour  Intake 2430.01 ml  Output 245 ml  Net 2185.01 ml   Filed Weights   03/01/19 1134  Weight: 92.5 kg    Exam:  . General: 53 y.o. year-old male well-developed well-nourished in no acute distress.  Alert oriented  x3. . Cardiovascular: Regular rate and rhythm with no rubs or gallops.  No JVD or thyromegaly . Respiratory: Improved posterior wheezes bilaterally with good inspiratory effort. . Abdomen: Soft nontender nondistended with normal bowel sounds x4 quadrants.  Colostomy bag in place left lower quadrant.  With stool content. . Musculoskeletal: No lower extremity edema. 2/4 pulses in all 4 extremities. Marland Kitchen Psychiatry: Mood is appropriate for condition and setting   Data Reviewed: CBC: Recent Labs  Lab 02/26/19 0126 02/27/19 0325 03/01/19 1209 03/01/19 1653 03/02/19 0532  WBC 10.1 11.1* 9.5 9.3 10.1  NEUTROABS  --   --  7.0  --  6.9  HGB 9.8* 9.9* 10.7* 10.2* 10.1*  HCT 29.7* 30.6* 33.4* 32.6* 31.9*  MCV 91.1 92.7 93.8 95.9 94.9  PLT 198 207 287 243 276   Basic Metabolic Panel: Recent Labs  Lab 02/25/19 0223 02/26/19 0126 02/27/19 0325 03/01/19 1209 03/01/19 1653 03/02/19 0532  NA 144 144 139 139  --  138  K 4.5 3.8 3.4* 3.7  --  3.6  CL 115* 113* 111 106  --  104  CO2 --  26  GLUCOSE 164* 110* 167* 109*  --  112*  BUN --  6  CREATININE 0.85 0.87 0.84 0.64 0.64 0.69  CALCIUM 7.9* 7.7* 7.7* 8.2*  --  8.1*  MG  --   --  2.1  --   --   --   PHOS  --   --  2.1*  --   --   --    GFR: Estimated Creatinine Clearance: 117.7 mL/min (by C-G formula based on SCr of 0.69 mg/dL). Liver Function Tests: Recent Labs  Lab 03/01/19 1209 03/02/19 0532  AST 86* 75*  ALT 70* 83*  ALKPHOS 43 41  BILITOT 1.0 0.6  PROT 6.3* 5.9*  ALBUMIN 2.7* 2.6*   No results for input(s): LIPASE, AMYLASE in the last 168 hours. No results for input(s): AMMONIA in the last 168 hours. Coagulation Profile: No results for input(s): INR, PROTIME in the last 168 hours. Cardiac Enzymes: Recent Labs  Lab 03/01/19 1209  TROPONINI <0.03   BNP (last 3 results) No results for input(s): PROBNP in the last 8760 hours. HbA1C: No results for input(s): HGBA1C in the last 72  hours. CBG: No results for input(s): GLUCAP in the last 168 hours. Lipid Profile: No results for input(s): CHOL, HDL, LDLCALC, TRIG, CHOLHDL, LDLDIRECT in the last 72 hours. Thyroid Function Tests: No results for input(s): TSH, T4TOTAL, FREET4, T3FREE, THYROIDAB in the last 72 hours. Anemia Panel: No results for input(s): VITAMINB12, FOLATE, FERRITIN, TIBC, IRON, RETICCTPCT in the last 72 hours. Urine analysis:    Component Value Date/Time   COLORURINE YELLOW 03/01/2019 1221   APPEARANCEUR CLEAR 03/01/2019 1221   LABSPEC 1.019 03/01/2019 1221   PHURINE 8.0 03/01/2019 1221   GLUCOSEU NEGATIVE 03/01/2019 1221   HGBUR NEGATIVE 03/01/2019 1221   BILIRUBINUR NEGATIVE 03/01/2019 1221   KETONESUR NEGATIVE 03/01/2019 1221   PROTEINUR NEGATIVE 03/01/2019 1221   NITRITE NEGATIVE 03/01/2019 1221   LEUKOCYTESUR NEGATIVE 03/01/2019 1221   Sepsis Labs: (procalcitonin:4,lacticidven:4)  ) Recent Results (from the past 240  hour(s))  Surgical pcr screen     Status: Abnormal   Collection Time: 02/24/19  9:17 AM  Result Value Ref Range Status   MRSA, PCR NEGATIVE NEGATIVE Final   Staphylococcus aureus POSITIVE (A) NEGATIVE Final    Comment: (NOTE) The Xpert SA Assay (FDA approved for NASAL specimens in patients 28 years of age and older), is one component of a comprehensive surveillance program. It is not intended to diagnose infection nor to guide or monitor treatment. Performed at [facility], [address], [place], [place] [address]   Respiratory Panel by PCR     Status: Abnormal   Collection Time: 03/01/19  4:48 PM  Result Value Ref Range Status   Adenovirus NOT DETECTED NOT DETECTED Final   Coronavirus 229E NOT DETECTED NOT DETECTED Final    Comment: (NOTE) The Coronavirus on the Respiratory Panel, DOES NOT test for the novel  Coronavirus (2019 nCoV)    Coronavirus HKU1 NOT DETECTED NOT DETECTED Final   Coronavirus NL63 NOT DETECTED NOT DETECTED Final    Coronavirus OC43 DETECTED (A) NOT DETECTED Final   Metapneumovirus NOT DETECTED NOT DETECTED Final   Rhinovirus / Enterovirus NOT DETECTED NOT DETECTED Final   Influenza A NOT DETECTED NOT DETECTED Final   Influenza B NOT DETECTED NOT DETECTED Final   Parainfluenza Virus 1 NOT DETECTED NOT DETECTED Final   Parainfluenza Virus 2 NOT DETECTED NOT DETECTED Final   Parainfluenza Virus 3 NOT DETECTED NOT DETECTED Final   Parainfluenza Virus 4 NOT DETECTED NOT DETECTED Final   Respiratory Syncytial Virus NOT DETECTED NOT DETECTED Final   Bordetella pertussis NOT DETECTED NOT DETECTED Final   Chlamydophila pneumoniae NOT DETECTED NOT DETECTED Final   Mycoplasma pneumoniae NOT DETECTED NOT DETECTED Final    Comment: Performed at [facility], [address] [place], [place] [address]  Culture, blood (routine x 2) Call MD if unable to obtain prior to antibiotics being given     Status: None (Preliminary result)   Collection Time: 03/01/19  4:53 PM  Result Value Ref Range Status   Specimen Description   Final    BLOOD LEFT ANTECUBITAL Performed at [facility], [address], [place], [place] [address]    Special Requests   Final    BOTTLES DRAWN AEROBIC AND ANAEROBIC Blood Culture adequate volume Performed at [facility], [address], [place], [place] [address]    Culture   Final    NO GROWTH 2 DAYS Performed at [facility], [address], [place], [place] [address]    Report Status PENDING  Incomplete  Culture, blood (routine x 2) Call MD if unable to obtain prior to antibiotics being given     Status: None (Preliminary result)   Collection Time: 03/01/19  5:01 PM  Result Value Ref Range Status   Specimen Description   Final    BLOOD LEFT HAND Performed at [facility], [address], [place], [place] [address]    Special Requests   Final    BOTTLES DRAWN AEROBIC AND ANAEROBIC Blood Culture adequate  volume Performed at [facility], [address], [place], [place] [address]    Culture   Final    NO GROWTH 2 DAYS Performed at [facility], [address] [place], [place] [address]    Report Status PENDING  Incomplete      Studies: No results found.  Scheduled Meds: . acidophilus  1 capsule Oral Daily  . amLODipine  5 mg Oral Daily  . enoxaparin (LOVENOX) injection  40 mg Subcutaneous Q24H  . guaiFENesin  1,200 mg Oral BID  . ipratropium-albuterol  3 mL Nebulization TID  . methocarbamol  750 mg Oral TID  . methylPREDNISolone (SOLU-MEDROL) injection  60 mg Intravenous Q8H  . sodium chloride HYPERTONIC  4 mL Nebulization BID    Continuous Infusions: . sodium chloride 50 mL/hr at 03/03/19 0357  . ceFEPime (MAXIPIME) IV 2 g (03/03/19 0656)     LOS: 2 days     Darlin Drop, MD [facility] Pager [phone]  If 7PM-7AM, please contact night-coverage [email or web address] Password [facility] 03/03/2019, 10:23 AM

## 2019-03-04 LAB — CBC WITH DIFFERENTIAL/PLATELET
Abs Immature Granulocytes: 0.97 10*3/uL — ABNORMAL HIGH (ref 0.00–0.07)
Basophils Absolute: 0 10*3/uL (ref 0.0–0.1)
Basophils Relative: 0 %
Eosinophils Absolute: 0 10*3/uL (ref 0.0–0.5)
Eosinophils Relative: 0 %
HCT: 32.7 % — ABNORMAL LOW (ref 39.0–52.0)
HEMOGLOBIN: 10.4 g/dL — AB (ref 13.0–17.0)
Immature Granulocytes: 5 %
Lymphocytes Relative: 8 %
Lymphs Abs: 1.6 10*3/uL (ref 0.7–4.0)
MCH: 29.9 pg (ref 26.0–34.0)
MCHC: 31.8 g/dL (ref 30.0–36.0)
MCV: 94 fL (ref 80.0–100.0)
MONO ABS: 0.6 10*3/uL (ref 0.1–1.0)
Monocytes Relative: 3 %
Neutro Abs: 16.4 10*3/uL — ABNORMAL HIGH (ref 1.7–7.7)
Neutrophils Relative %: 84 %
Platelets: 462 10*3/uL — ABNORMAL HIGH (ref 150–400)
RBC: 3.48 MIL/uL — AB (ref 4.22–5.81)
RDW: 12.5 % (ref 11.5–15.5)
WBC: 19.6 10*3/uL — AB (ref 4.0–10.5)
nRBC: 0 % (ref 0.0–0.2)

## 2019-03-04 LAB — EXPECTORATED SPUTUM ASSESSMENT W GRAM STAIN, RFLX TO RESP C: Sputum evaluation: NONE SEEN

## 2019-03-04 LAB — BASIC METABOLIC PANEL
Anion gap: 8 (ref 5–15)
BUN: 15 mg/dL (ref 6–20)
CHLORIDE: 105 mmol/L (ref 98–111)
CO2: 26 mmol/L (ref 22–32)
Calcium: 8.6 mg/dL — ABNORMAL LOW (ref 8.9–10.3)
Creatinine, Ser: 0.72 mg/dL (ref 0.61–1.24)
GFR calc Af Amer: >60 mL/min (ref >60)
GFR calc non Af Amer: >60 mL/min (ref >60)
Glucose, Bld: 157 mg/dL — ABNORMAL HIGH (ref 70–99)
Potassium: 3.6 mmol/L (ref 3.5–5.1)
SODIUM: 139 mmol/L (ref 135–145)

## 2019-03-04 LAB — MAGNESIUM: MAGNESIUM: 2.2 mg/dL (ref 1.7–2.4)

## 2019-03-04 MED ORDER — BENZONATATE 100 MG PO CAPS: 200.0000 mg | ORAL_CAPSULE | Freq: Two times a day (BID) | ORAL | Status: DC | PRN

## 2019-03-04 MED ORDER — AMLODIPINE BESYLATE 5 MG PO TABS: 5.0000 mg | ORAL_TABLET | Freq: Every day | ORAL | Status: AC

## 2019-03-04 MED ORDER — IPRATROPIUM-ALBUTEROL 0.5-2.5 (3) MG/3ML IN SOLN: 3.0000 mL | Freq: Two times a day (BID) | RESPIRATORY_TRACT | Status: DC

## 2019-03-04 MED ORDER — CEFDINIR 300 MG PO CAPS
300.0000 mg | ORAL_CAPSULE | Freq: Two times a day (BID) | ORAL | Status: DC
  Administered 2019-03-04: 300 mg via ORAL
  Filled 2019-03-04: qty 1

## 2019-03-04 MED ORDER — PREDNISONE 20 MG PO TABS: 20.0000 mg | ORAL_TABLET | Freq: Every day | ORAL | Status: DC

## 2019-03-04 MED ORDER — AMLODIPINE BESYLATE 5 MG PO TABS: 5.0000 mg | ORAL_TABLET | Freq: Every day | ORAL | Status: DC

## 2019-03-04 MED ORDER — CEFDINIR 300 MG PO CAPS: 300.0000 mg | ORAL_CAPSULE | Freq: Two times a day (BID) | ORAL | 0 refills | Status: AC

## 2019-03-04 MED ORDER — LORAZEPAM 0.5 MG PO TABS
0.5000 mg | ORAL_TABLET | Freq: Once | ORAL | Status: AC
  Administered 2019-03-04: 0.5 mg via ORAL

## 2019-03-04 MED ORDER — PREDNISONE 20 MG PO TABS: 20.0000 mg | ORAL_TABLET | Freq: Every day | ORAL | 0 refills | Status: AC

## 2019-03-04 MED ORDER — ALBUTEROL SULFATE (2.5 MG/3ML) 0.083% IN NEBU: 2.5000 mg | INHALATION_SOLUTION | Freq: Two times a day (BID) | RESPIRATORY_TRACT | 0 refills | Status: DC | PRN

## 2019-03-04 MED ORDER — BENZONATATE 200 MG PO CAPS: 200.0000 mg | ORAL_CAPSULE | Freq: Two times a day (BID) | ORAL | 0 refills | Status: DC | PRN

## 2019-03-04 NOTE — Progress Notes (Signed)
Patient has discharged to home with [facility] on 03/04/2019. Discharge instruction including medication and appointment was given to patient. CM is aware of nebulizer machine before DC. Patient has no question at this time.

## 2019-03-04 NOTE — Discharge Summary (Signed)
Discharge Summary  Christopher Olson UDT:[id number] DOB: 09-04-1966  PCP: [name], MD  Admit date: 03/01/2019 Discharge date: 03/04/2019  Time spent: 35 minutes  Recommendations for Outpatient Follow-up:  1. Follow-up with your PCP 2. Follow-up with general surgery 3. Take your medications as prescribed  Discharge Diagnoses:  Active Hospital Problems   Diagnosis Date Noted  . HCAP (healthcare-associated pneumonia) 03/01/2019  . Anxiety 03/01/2019  . S/P partial colectomy 03/01/2019  . Respiratory failure (HCC) 03/01/2019    Resolved Hospital Problems  No resolved problems to display.    Discharge Condition: Stable  Diet recommendation: Resume previous diet  Vitals:   03/04/19 0527 03/04/19 0749  BP: (!) 149/87   Pulse: 65   Resp: 19   Temp: 97.7 F (36.5 C)   SpO2: 99% 99%    History of present illness:  This patient is a 53 yr old man who was discharged from this hospital on 02/28/2019 after a 6 day stay during which he underwent an exploratory laparotomy with colon resection with colostomy for obstruction at the sigmoid level. He left with a wound vac and 7 days of augmentin and flagyl. The patient states that beginning one day prior to the day of discharge he had been complaining of a cough that was productive of sputum. He was given nebulizer treatments and went home with an albuterol inhaler.  He returned to the ED due to these complaints. CTA of the chest was negative for PE, but demonstrated a patchy opacity of the right lower lobe and to a lesser degree the left lower lobe.   CXR demonstrated bilateral lower lobe patchy infiltrates. TRH asked to admit.  Hospital course complicated by hypoxia and audible wheezing treated and improved.    03/04/19: Patient seen and examined at his bedside.  No acute events overnight.  He states his breathing and cough are improved.  Has been ambulating in the room.  Home O2 evaluation and PT assessment planned prior to  discharge.  Also wound care specialist visit for wound VAC change.  Vital signs and lab studies reviewed and are stable.  On the day of discharge, the patient was hemodynamically stable.  He will need to follow-up with his primary care provider and his general surgeon for his colostomy post hospitalization.     Hospital Course:  Active Problems:   HCAP (healthcare-associated pneumonia)   Anxiety   S/P partial colectomy   Respiratory failure (HCC)  HCAP, poa Presented with productive cough, hypoxia, tachypnea and imaging indicative of pneumonia. Independently reviewed CT a of the chest and chest x-ray done on admission which revealed bilateral lower lobes infiltrate versus atelectasis Completed 3 days of IV cefepime MRSA PCR negative on 02/24/2019, DC IV vancomycin Continue nebs twice daily as needed for shortness of breath and wheezing Completed IV Solu-Medrol Continue prednisone 20 mg daily x5 days Continue cefdinir 300 mg twice daily x4 days Follow-up with your PCP  Coronavirus infection, poa Respiratory panel does not test for the novel coronavirus 2019 Continue supportive care Continue droplet precautions  Acute hypoxic respiratory failure secondary to above: Coronavirus infection OC43 and HCAP, poa Management as stated above Not on O2 supplementation at baseline Obtain home O2 evaluation 03/04/2019   Improving uncontrolled hypertension Improving with Norvasc 5 mg daily, continue Follow-up with your PCP Self-reported not previously on antihypertensive medications  Bowel obstruction status post exploratory laparotomy with partial colectomy with end colostomy and wound VAC placement by Dr. Ellouise Newer on 02/24/2019. Wound care specialist to manage  wound VAC Wound care specialist to reevaluate and change wound VAC on 03/04/2019 Pain medications as needed No apparent issues Follow-up with general surgery outpatient  Moderate protein calorie malnutrition Albumin  2.6 Encouraged to increase oral protein intake    Code Status: Full code    Consultants:  Wound care specialist   Antimicrobials:  Completed 3 days of IV cefepime  Start cefdinir 300 mg twice daily x4 days     Discharge Exam: BP (!) 149/87 (BP Location: Left Arm)   Pulse 65   Temp 97.7 F (36.5 C) (Oral)   Resp 19   Ht  (1.676 m)   Wt 92.5 kg   SpO2 99%   BMI 32.93 kg/m  . General: 53 y.o. year-old male well developed well nourished in no acute distress.  Alert and oriented x3. . Cardiovascular: Regular rate and rhythm with no rubs or gallops.  No thyromegaly or JVD noted.   Marland Kitchen Respiratory: Clear to auscultation with no wheezes or rales. Good inspiratory effort. . Abdomen: Soft nontender nondistended with normal bowel sounds x4 quadrants.  Colostomy bag in place with stool content. . Musculoskeletal: No lower extremity edema. 2/4 pulses in all 4 extremities. Marland Kitchen Psychiatry: Mood is appropriate for condition and setting  Discharge Instructions You were cared for by a hospitalist during your hospital stay. If you have any questions about your discharge medications or the care you received while you were in the hospital after you are discharged, you can call the unit and asked to speak with the hospitalist on call if the hospitalist that took care of you is not available. Once you are discharged, your primary care physician will handle any further medical issues. Please note that NO REFILLS for any discharge medications will be authorized once you are discharged, as it is imperative that you return to your primary care physician (or establish a relationship with a primary care physician if you do not have one) for your aftercare needs so that they can reassess your need for medications and monitor your lab values.   Allergies as of 03/04/2019      Reactions   Shellfish-derived Products Anaphylaxis   Lactose Intolerance (gi) Diarrhea, Other (See Comments)    Bloating, also      Medication List    STOP taking these medications   amoxicillin-clavulanate 875-125 MG tablet Commonly known as:  AUGMENTIN     TAKE these medications   acetaminophen 500 MG tablet Commonly known as:  TYLENOL Take 2 tablets (1,000 mg total) by mouth every 6 (six) hours. What changed:    when to take this  reasons to take this   albuterol 108 (90 Base) MCG/ACT inhaler Commonly known as:  PROVENTIL HFA;VENTOLIN HFA Inhale 2 puffs into the lungs every 6 (six) hours as needed for wheezing or shortness of breath. What changed:  Another medication with the same name was added. Make sure you understand how and when to take each.   albuterol (2.5 MG/3ML) 0.083% nebulizer solution Commonly known as:  PROVENTIL Take 3 mLs (2.5 mg total) by nebulization 2 (two) times daily as needed for wheezing or shortness of breath. What changed:  You were already taking a medication with the same name, and this prescription was added. Make sure you understand how and when to take each.   amLODipine 5 MG tablet Commonly known as:  NORVASC Take 1 tablet (5 mg total) by mouth daily. Start taking on:  March 05, 2019   benzonatate 200 MG  capsule Commonly known as:  TESSALON Take 1 capsule (200 mg total) by mouth 2 (two) times daily as needed for cough.   cefdinir 300 MG capsule Commonly known as:  OMNICEF Take 1 capsule (300 mg total) by mouth every 12 (twelve) hours for 4 days.   ibuprofen 200 MG tablet Commonly known as:  ADVIL,MOTRIN Take 3 tablets (600 mg total) by mouth 3 (three) times daily. What changed:    when to take this  reasons to take this   LORazepam 0.5 MG tablet Commonly known as:  ATIVAN Take 0.5 mg by mouth at bedtime as needed for anxiety or sleep.   methocarbamol 750 MG tablet Commonly known as:  ROBAXIN Take 1 tablet (750 mg total) by mouth 3 (three) times daily.   oxyCODONE 5 MG immediate release tablet Commonly known as:  Oxy  IR/ROXICODONE Take 1-2 tablets (5-10 mg total) by mouth every 4 (four) hours as needed for moderate pain or severe pain.   predniSONE 20 MG tablet Commonly known as:  DELTASONE Take 1 tablet (20 mg total) by mouth daily with breakfast for 5 days. Start taking on:  March 05, 2019   PROBIOTIC PO Take 1 capsule by mouth daily. Unknown name of medication but is kept in refrigerator            Durable Medical Equipment  (From admission, onward)         Start     Ordered   03/04/19 1100  For home use only DME Nebulizer machine  Once    Question:  Patient needs a nebulizer to treat with the following condition  Answer:  CAP (community acquired pneumonia)   03/04/19 1059         Allergies  Allergen Reactions  . Shellfish-Derived Products Anaphylaxis  . Lactose Intolerance (Gi) Diarrhea and Other (See Comments)    Bloating, also   Follow-up Information    [name], MD. Call in 1 day(s).   Specialty:  Family Medicine Why:  Please call for a post hospital follow-up appointment Contact information: [address] [place] [address] [phone]            The results of significant diagnostics from this hospitalization (including imaging, microbiology, ancillary and laboratory) are listed below for reference.    Significant Diagnostic Studies: Dg Chest 2 View  Result Date: 03/01/2019 CLINICAL DATA:  Short of breath. EXAM: CHEST - 2 VIEW COMPARISON:  None. FINDINGS: The heart size and mediastinal contours are within normal limits. Both lungs are clear. The visualized skeletal structures are unremarkable. IMPRESSION: No active cardiopulmonary disease. Electronically Signed   By: Signa Kell M.D.   On: 03/01/2019 12:11   Dg Abd 1 View  Result Date: 02/24/2019 CLINICAL DATA:  Follow-up colonic dilatation EXAM: ABDOMEN - 1 VIEW COMPARISON:  02/23/2019 FINDINGS: Nasogastric catheter is noted within the stomach. Persistent colonic dilatation is seen.  Mild improvement in the descending colon is noted although the more proximal colon remains distended. No free air is seen. No abnormal mass is noted. IMPRESSION: Slight improvement in colonic dilatation when compared with the prior exam. Electronically Signed   By: Alcide Clever M.D.   On: 02/24/2019 08:20   Dg Abd 1 View  Result Date: 02/23/2019 CLINICAL DATA:  NG Tube placement EXAM: ABDOMEN - 1 VIEW COMPARISON:  CT of the abdomen and pelvis 02/23/2011 FINDINGS: Interval placement of nasogastric tube, tip overlying the level of the stomach. Air is identified within the  stomach. There is persistent significant dilatation of large bowel the descending colon measuring up to 9.7 centimeters. No plain film evidence for free intraperitoneal air. Contrast within the bladder. IMPRESSION: 1. Nasogastric tube tip overlying the level of the stomach. 2. Persistent significant dilatation of the colon. Electronically Signed   By: Norva Pavlov M.D.   On: 02/23/2019 17:22   Ct Angio Chest Pe W And/or Wo Contrast  Result Date: 03/01/2019 CLINICAL DATA:  Chest pain. EXAM: CT ANGIOGRAPHY CHEST WITH CONTRAST TECHNIQUE: Multidetector CT imaging of the chest was performed using the standard protocol during bolus administration of intravenous contrast. Multiplanar CT image reconstructions and MIPs were obtained to evaluate the vascular anatomy. CONTRAST:  75mL OMNIPAQUE IOHEXOL 350 MG/ML SOLN COMPARISON:  Chest x-ray February 28, 2019 FINDINGS: Cardiovascular: No definite focal filling defect is identified in the main pulmonary and segmental pulmonary arteries of bilateral upper lobes. Evaluation of subsegmental and segmental pulmonary arteries of the bilateral mid to lower lungs are limited due to respiratory motion. The heart size is normal. There is no pericardial effusion. Mediastinum/Nodes: No enlarged mediastinal, hilar, or axillary lymph nodes. Thyroid gland, trachea, and esophagus demonstrate no significant findings.  Lungs/Pleura: Scarring of right apex is noted. Patchy opacity of the right lower lobe and to a lesser degree left lower lobe are identified. There is no pleural effusion. Upper Abdomen: No acute abnormality. Musculoskeletal: No acute abnormality. Review of the MIP images confirms the above findings. IMPRESSION: No definite filling defect identified to suggest pulmonary embolus in the main pulmonary arteries. Valuation of the segmental subsegmental pulmonary arteries of the mid to lower lungs are limited due to respiratory motion. Patchy opacity of the right lower lobe and to a lesser degree left lower lobe, developing pneumonias are not excluded. Electronically Signed   By: Sherian Rein M.D.   On: 03/01/2019 13:49   Ct Abdomen Pelvis W Contrast  Result Date: 02/23/2019 CLINICAL DATA:  53 year old male with generalized abdominal pain and nausea. History of sigmoid diverticulitis. EXAM: CT ABDOMEN AND PELVIS WITH CONTRAST TECHNIQUE: Multidetector CT imaging of the abdomen and pelvis was performed using the standard protocol following bolus administration of intravenous contrast. CONTRAST:  OMNIPAQUE IOHEXOL 300 MG/ML  SOLN COMPARISON:  CT of the abdomen pelvis dated 10/04/2012 FINDINGS: Lower chest: The visualized lung bases are clear. No intra-abdominal free air or free fluid. Hepatobiliary: The liver is unremarkable. No intrahepatic biliary ductal dilatation. The gallbladder is unremarkable. Pancreas: Unremarkable. No pancreatic ductal dilatation or surrounding inflammatory changes. Spleen: Normal in size without focal abnormality. Adrenals/Urinary Tract: Adrenal glands are unremarkable. Kidneys are normal, without renal calculi, focal lesion, or hydronephrosis. Bladder is unremarkable. Stomach/Bowel: There is sigmoid diverticulosis. There is a 6 cm segmental area of thickening of the sigmoid colon (series 3 image 65 and coronal series 6, image 87) most likely muscular hypertrophy and with luminal  stricture secondary to prior diverticulitis. Underlying mass is favored less likely but not entirely excluded. Follow-up with colonoscopy is recommended. There is associated luminal narrowing and high-grade obstruction with distension of stool filled colon proximal to this segment. The distended colon measures approximately 9 cm in diameter. A second area of stricture in the sigmoid colon is noted more distally. There is no evidence of small-bowel obstruction. The appendix is not visualized with certainty. No inflammatory changes identified in the right lower quadrant. Vascular/Lymphatic: The abdominal aorta and IVC are grossly unremarkable. No portal venous gas. There is no adenopathy. Reproductive: The the prostate and seminal vesicles are  grossly unremarkable. Other: None Musculoskeletal: Bilateral L5 pars defects. No listhesis. No acute osseous pathology. IMPRESSION: 1. High-grade obstruction of the sigmoid colon secondary to a segmental stricture sequela of prior diverticulitis. Underlying mass is favored less likely but not entirely excluded. Clinical correlation and follow-up with colonoscopy recommended. Diffusely dilated colon proximal to the strictures sigmoid segment measures up to 9 cm in diameter. 2. Colonic diverticulosis. Electronically Signed   By: Elgie Collard M.D.   On: 02/23/2019 06:58    Microbiology: Recent Results (from the past 240 hour(s))  Surgical pcr screen     Status: Abnormal   Collection Time: 02/24/19  9:17 AM  Result Value Ref Range Status   MRSA, PCR NEGATIVE NEGATIVE Final   Staphylococcus aureus POSITIVE (A) NEGATIVE Final    Comment: (NOTE) The Xpert SA Assay (FDA approved for NASAL specimens in patients 63 years of age and older), is one component of a comprehensive surveillance program. It is not intended to diagnose infection nor to guide or monitor treatment. Performed at [facility], [address], [place], [place] [address]   Respiratory Panel  by PCR     Status: Abnormal   Collection Time: 03/01/19  4:48 PM  Result Value Ref Range Status   Adenovirus NOT DETECTED NOT DETECTED Final   Coronavirus 229E NOT DETECTED NOT DETECTED Final    Comment: (NOTE) The Coronavirus on the Respiratory Panel, DOES NOT test for the novel  Coronavirus (2019 nCoV)    Coronavirus HKU1 NOT DETECTED NOT DETECTED Final   Coronavirus NL63 NOT DETECTED NOT DETECTED Final   Coronavirus OC43 DETECTED (A) NOT DETECTED Final   Metapneumovirus NOT DETECTED NOT DETECTED Final   Rhinovirus / Enterovirus NOT DETECTED NOT DETECTED Final   Influenza A NOT DETECTED NOT DETECTED Final   Influenza B NOT DETECTED NOT DETECTED Final   Parainfluenza Virus 1 NOT DETECTED NOT DETECTED Final   Parainfluenza Virus 2 NOT DETECTED NOT DETECTED Final   Parainfluenza Virus 3 NOT DETECTED NOT DETECTED Final   Parainfluenza Virus 4 NOT DETECTED NOT DETECTED Final   Respiratory Syncytial Virus NOT DETECTED NOT DETECTED Final   Bordetella pertussis NOT DETECTED NOT DETECTED Final   Chlamydophila pneumoniae NOT DETECTED NOT DETECTED Final   Mycoplasma pneumoniae NOT DETECTED NOT DETECTED Final    Comment: Performed at [facility], [address] [place], [place] [address]  Culture, blood (routine x 2) Call MD if unable to obtain prior to antibiotics being given     Status: None (Preliminary result)   Collection Time: 03/01/19  4:53 PM  Result Value Ref Range Status   Specimen Description   Final    BLOOD LEFT ANTECUBITAL Performed at [facility], [address], [place], [place] [address]    Special Requests   Final    BOTTLES DRAWN AEROBIC AND ANAEROBIC Blood Culture adequate volume Performed at [facility], [address] [place], [place] [address]    Culture   Final    NO GROWTH 3 DAYS Performed at [facility], [address], [place], [place] [address]    Report Status PENDING  Incomplete  Culture, blood  (routine x 2) Call MD if unable to obtain prior to antibiotics being given     Status: None (Preliminary result)   Collection Time: 03/01/19  5:01 PM  Result Value Ref Range Status   Specimen Description   Final    BLOOD LEFT HAND Performed at [facility], [address], [place], [place] [address]    Special Requests   Final    BOTTLES DRAWN AEROBIC AND ANAEROBIC Blood Culture adequate volume Performed at [facility], [address] [place], [place] [address]    Culture   Final    NO GROWTH 3 DAYS Performed at [facility], [address], [place], [place] [address]    Report Status PENDING  Incomplete  Culture, sputum-assessment     Status: None   Collection Time: 03/03/19  3:16 PM  Result Value Ref Range Status   Specimen Description EXPECTORATED SPUTUM  Final   Special Requests NONE  Final   Sputum evaluation   Final    NO SQUAMOUS EPITHELIAL CELLS SEEN THIS SPECIMEN IS ACCEPTABLE FOR SPUTUM CULTURE Performed at [facility], [address], [place], [place] [address]    Report Status 03/04/2019 FINAL  Final  Culture, respiratory     Status: None (Preliminary result)   Collection Time: 03/03/19  3:16 PM  Result Value Ref Range Status   Specimen Description   Final    EXPECTORATED SPUTUM Performed at [facility], [address], [place], [place] [address]    Special Requests   Final    NONE Reflexed from [id number] Performed at [facility], [address] [place], [place] [address]    Gram Stain   Final    MODERATE WBC PRESENT, PREDOMINANTLY PMN NO ORGANISMS SEEN Performed at [facility], [address], [place], [place] [address]    Culture PENDING  Incomplete   Report Status PENDING  Incomplete     Labs: Basic Metabolic Panel: Recent Labs  Lab 02/26/19 0126 02/27/19 0325 03/01/19 1209 03/01/19 1653 03/02/19 0532 03/04/19 0542  NA 144 139 139  --   138 139  K 3.8 3.4* 3.7  --  3.6 3.6  CL 113* 111 106  --  104 105  CO2 23 23 25   --  26 26  GLUCOSE 110* 167* 109*  --  112* 157*  BUN 12 12 7   --  6 15  CREATININE 0.87 0.84 0.64 0.64 0.69 0.72  CALCIUM 7.7* 7.7* 8.2*  --  8.1* 8.6*  MG  --  2.1  --   --   --  2.2  PHOS  --  2.1*  --   --   --   --    Liver Function Tests: Recent Labs  Lab 03/01/19 1209 03/02/19 0532  AST 86* 75*  ALT 70* 83*  ALKPHOS 43 41  BILITOT 1.0 0.6  PROT 6.3* 5.9*  ALBUMIN 2.7* 2.6*   No results for input(s): LIPASE, AMYLASE in the last 168 hours. No results for input(s): AMMONIA in the last 168 hours. CBC: Recent Labs  Lab 02/27/19 0325 03/01/19 1209 03/01/19 1653 03/02/19 0532 03/04/19 0542  WBC 11.1* 9.5 9.3 10.1 19.6*  NEUTROABS  --  7.0  --  6.9 16.4*  HGB 9.9* 10.7* 10.2* 10.1* 10.4*  HCT 30.6* 33.4* 32.6* 31.9* 32.7*  MCV 92.7 93.8 95.9 94.9 94.0  PLT 207 287 243 276 462*   Cardiac Enzymes: Recent Labs  Lab 03/01/19 1209  TROPONINI <0.03   BNP: BNP (last 3 results) Recent Labs    03/01/19 1209  BNP 107.7*    ProBNP (last 3 results) No results for input(s): PROBNP in the last 8760 hours.  CBG: No results for input(s): GLUCAP in the last 168  hours.     Signed:  Darlin Droparole N , MD [facility] 03/04/2019, 11:21 AM

## 2019-03-04 NOTE — Care Management Note (Signed)
Case Management Note  Patient Details  Name: Airen Motte MRN: [id number] Date of Birth: 1966-11-02    Expected Discharge Date:  03/04/19               Expected Discharge Plan:     In-House Referral:     Discharge planning Services     Post Acute Care Choice:    Choice offered to:     DME Arranged:   Nebulizer DME Agency:   [facility]  HH Arranged:   Resumption of HHRN HH Agency:   [facility])  Status of Service:     If discussed at Long Length of Stay Meetings, dates discussed:    Additional Comments:  Bartholome Bill, RN 03/04/2019, 12:52 PM

## 2019-03-04 NOTE — Consult Note (Signed)
WOC Nurse wound follow up Wound type:Midline VAC dressing.  Switch to home unit for discharge.  Measurement: 12 cm x 4 cm x 3.5 cm  Wound LTJ:QZESP red and yellow adipose Drainage (amount, consistency, odor) minimal serosanguinous Periwound:LUQ colostomy RLQ JP drain site (drain removed) Dressing procedure/placement/frequency: 1 piece black foam placed in wound bed. Seal achieved on home unit. To discharge today.   WOC Nurse ostomy follow up Stoma type/location: LUQ colostomy Stomal assessment/size: 1 5/8" pink moist and producing soft brown stool.  Patient independent in emptying Peristomal assessment: Used barrier ring today to promote seal Treatment options for stomal/peristomal skin: barrier ring Output soft brown stool Ostomy pouching: 2pc. 2 3/4" pouch with barrier ring  Education provided: Discussed twice weekly pouch changes.  I avoided NPWT dressing today, but may wish to change when that is changed for now to get additional teaching while HH is in.  Enrolled patient in [facility] Secure Start Discharge program: Yes Will not follow at this time.  Please re-consult if needed.  Maple Hudson MSN, RN, FNP-BC CWON Wound, Ostomy, Continence Nurse Pager [phone]

## 2019-03-04 NOTE — Progress Notes (Signed)
SATURATION QUALIFICATIONS: (This note is used to comply with regulatory documentation for home oxygen)  Patient Saturations on Room Air at Rest = 92%  Patient Saturations on Room Air while Ambulating = 91%  Patient Saturations on 0 Liters of oxygen while Ambulating = 91%  Pt with no home O2 needs, sats >90% on RA when ambulating.   Nicola Police, PT Acute Rehabilitation Services Pager [phone]  Office [phone]

## 2019-03-04 NOTE — Evaluation (Signed)
Physical Therapy Evaluation Patient Details Name: Christopher Olson MRN: [id number] DOB: [birth year] Today's Date: 03/04/2019   History of Present Illness  53 yo male admitted to ED on 3/6 with shortness of breath, HCAP. Per hospitalist note, pt monitored for coronavirus. CT - for PE. Pt recently hospitalized and had colon resection and colostomy 3/1, d/c 3/5. PMH includes anxiety, diverticulitis, meniere's disease with hearing loss.  Clinical Impression   Pt presents with decreased activity tolerance and dyspnea on exertion. Pt to benefit from acute PT services to address these deficits. Pt ambulated hallway distance with appropriate DME (droplet precautions - mask applied), reported feeling winded intermittently. PT encouraged pt to take rest breaks as needed during session, required one standing rest break. Sats WFL during ambulation on RA. Pt is anxious about breathing and wants to make sure he is safe to d/c, as pt states he felt he was not ready to d/c on 3/5 but d/ced anyway. PT to continue to follow acutely to address decreased activity tolerance and dyspnea on exertion, but no PT follow up recommended.     Follow Up Recommendations No PT follow up    Equipment Recommendations  None recommended by PT    Recommendations for Other Services       Precautions / Restrictions Precautions Precautions: Fall Precaution Comments: droplet, pt and PT with mask application in hallway. PT with mask, gloves, and gown for pt care/interactions  Restrictions Weight Bearing Restrictions: No      Mobility  Bed Mobility Overal bed mobility: Modified Independent             General bed mobility comments: increased time   Transfers Overall transfer level: Modified independent                  Ambulation/Gait Ambulation/Gait assistance: Supervision Gait Distance (Feet): 350 Feet Assistive device: None Gait Pattern/deviations: WFL(Within Functional Limits);Decreased stride length Gait  velocity: normal   General Gait Details: Supervision for safety. Pt with no periods of unsteadiness noted. Pt on RA during ambulation, sats to 91%. No supplemental O2 needed during ambulation. Pt with one standing rest break for dyspnea 2/4.  Stairs            Wheelchair Mobility    Modified Rankin (Stroke Patients Only)       Balance Overall balance assessment: Modified Independent                                           Pertinent Vitals/Pain Pain Assessment: No/denies pain    Home Living Family/patient expects to be discharged to:: Private residence Living Arrangements: Spouse/significant other;Children Available Help at Discharge: Family Type of Home: House Home Access: Level entry     Home Layout: One level Home Equipment: None      Prior Function Level of Independence: Independent               Hand Dominance   Dominant Hand: Right    Extremity/Trunk Assessment   Upper Extremity Assessment Upper Extremity Assessment: Overall WFL for tasks assessed    Lower Extremity Assessment Lower Extremity Assessment: Overall WFL for tasks assessed    Cervical / Trunk Assessment Cervical / Trunk Assessment: Normal  Communication   Communication: No difficulties  Cognition Arousal/Alertness: Awake/alert Behavior During Therapy: Anxious Overall Cognitive Status: Within Functional Limits for tasks assessed  General Comments: Pt and pt's wife with list of questions, PT answered relevant questions and deferred other questions to RN and MD.       General Comments      Exercises     Assessment/Plan    PT Assessment Patient needs continued PT services  PT Problem List Cardiopulmonary status limiting activity;Decreased activity tolerance       PT Treatment Interventions Gait training;Balance training;Functional mobility training;Therapeutic exercise    PT Goals (Current goals can be  found in the Care Plan section)  Acute Rehab PT Goals Patient Stated Goal: go home, breathe better  PT Goal Formulation: With patient Time For Goal Achievement: 03/18/19 Potential to Achieve Goals: Good    Frequency Min 3X/week   Barriers to discharge        Co-evaluation               AM-PAC PT "6 Clicks" Mobility  Outcome Measure Help needed turning from your back to your side while in a flat bed without using bedrails?: None Help needed moving from lying on your back to sitting on the side of a flat bed without using bedrails?: None Help needed moving to and from a bed to a chair (including a wheelchair)?: None Help needed standing up from a chair using your arms (e.g., wheelchair or bedside chair)?: None Help needed to walk in hospital room?: None Help needed climbing 3-5 steps with a railing? : None 6 Click Score: 24    End of Session   Activity Tolerance: Patient tolerated treatment well Patient left: in chair;with call bell/phone within reach Nurse Communication: Mobility status;Other (comment)(sats WFL during ambulation on RA) PT Visit Diagnosis: Other abnormalities of gait and mobility (R26.89)    Time: 6073-7106 PT Time Calculation (min) (ACUTE ONLY): 27 min   Charges:   PT Evaluation $PT Eval Low Complexity: 1 Low PT Treatments $Gait Training: 8-22 mins       Nicola Police, PT Acute Rehabilitation Services Pager [phone]  Office [phone]   Arriyah Madej D Amaiah Cristiano 03/04/2019, 1:06 PM

## 2019-03-06 LAB — CULTURE, BLOOD (ROUTINE X 2)
Culture: NO GROWTH
Culture: NO GROWTH
Special Requests: ADEQUATE
Special Requests: ADEQUATE

## 2019-03-06 LAB — CULTURE, RESPIRATORY W GRAM STAIN: Culture: NORMAL

## 2019-03-06 LAB — ADENOVIRUS ANTIBODIES: Adenovirus Antibody: NEGATIVE

## 2019-05-10 ENCOUNTER — Ambulatory Visit: Payer: Self-pay | Admitting: General Surgery

## 2019-07-23 ENCOUNTER — Other Ambulatory Visit (HOSPITAL_COMMUNITY)
Admission: RE | Admit: 2019-07-23 | Discharge: 2019-07-23 | Disposition: A | Discharge status: Home / Self Care | Payer: BC Managed Care – PPO | Source: Ambulatory Visit | Attending: General Surgery | Admitting: General Surgery

## 2019-07-23 DIAGNOSIS — Z20828 Contact with and (suspected) exposure to other viral communicable diseases: Secondary | ICD-10-CM | POA: Insufficient documentation

## 2019-07-23 LAB — SARS CORONAVIRUS 2 (TAT 6-24 HRS): SARS Coronavirus 2: NEGATIVE

## 2019-07-24 ENCOUNTER — Encounter (HOSPITAL_COMMUNITY): Payer: Self-pay

## 2019-07-24 NOTE — Patient Instructions (Addendum)
DUE TO COVID-19 ONLY ONE VISITOR IS ALLOWED IN THE HOSPITAL AT THIS TIME. THEY MUST WAIT IN THE WAITING ROOM    COVID SWAB TESTING COMPLETED ON: July 23, 2019 (Must self quarantine after testing. Follow instructions on handout.)             Your procedure is scheduled on: Friday, July 26, 2019   Report to [facility] Main  Entrance    Report to admitting at 10:00 AM   Call this number if you have problems the morning of surgery [phone]   The night before surgery drink 2 Ensure Pre surgery Drinks   Do not eat food:After Midnight.   May have liquids until 9:00AM day of surgery   Complete one Ensure drink the morning of surgery at 9:00AM the day of surgery.     CLEAR LIQUID DIET  Foods Allowed                                                                     Foods Excluded  Water, Black Coffee and tea, regular and decaf                             liquids that you cannot  Plain Jell-O in any flavor  (No red)                                           see through such as: Fruit ices (not with fruit pulp)                                     milk, soups, orange juice  Iced Popsicles (No red)                                    All solid food Carbonated beverages, regular and diet                                    Apple juices Sports drinks like Gatorade (No red) Lightly seasoned clear broth or consume(fat free) Sugar, honey syrup  Sample Menu Breakfast                                Lunch                                     Supper Cranberry juice                    Beef broth                            Chicken broth Jell-O  Grape juice                           Apple juice Coffee or tea                        Jell-O                                      Popsicle                                                Coffee or tea                        Coffee or tea      Brush your teeth the morning of surgery.    Take these  medicines the morning of surgery with A SIP OF WATER: Amlodipine                               You may not have any metal on your body including jewelry, and body piercings             Do not wear lotions, powders, perfumes/cologne, or deodorant                          Men may shave face and neck.   Do not bring valuables to the hospital. [facility] IS NOT             RESPONSIBLE   FOR VALUABLES.   Contacts, dentures or bridgework may not be worn into surgery.   Bring small overnight bag day of surgery.                 Please read over the following fact sheets you were given:  [facility] - Preparing for Surgery Before surgery, you can play an important role.  Because skin is not sterile, your skin needs to be as free of germs as possible.  You can reduce the number of germs on your skin by washing with CHG (chlorahexidine gluconate) soap before surgery.  CHG is an antiseptic cleaner which kills germs and bonds with the skin to continue killing germs even after washing. Please DO NOT use if you have an allergy to CHG or antibacterial soaps.  If your skin becomes reddened/irritated stop using the CHG and inform your nurse when you arrive at Short Stay. Do not shave (including legs and underarms) for at least 48 hours prior to the first CHG shower.  You may shave your face/neck.  Please follow these instructions carefully:  1.  Shower with CHG Soap the night before surgery and the  morning of surgery.  2.  If you choose to wash your hair, wash your hair first as usual with your normal  shampoo.  3.  After you shampoo, rinse your hair and body thoroughly to remove the shampoo.                             4.  Use CHG as you would any other  liquid soap.  You can apply chg directly to the skin and wash.  Gently with a scrungie or clean washcloth.  5.  Apply the CHG Soap to your body ONLY FROM THE NECK DOWN.   Do not use on face/ open                           Wound or open sores. Avoid  contact with eyes, ears mouth and genitals (private parts).                       Wash face,  Genitals (private parts) with your normal soap.             6.  Wash thoroughly, paying special attention to the area where your surgery  will be performed.  7.  Thoroughly rinse your body with warm water from the neck down.  8.  DO NOT shower/wash with your normal soap after using and rinsing off the CHG Soap.                9.  Pat yourself dry with a clean towel.            10.  Wear clean pajamas.            11.  Place clean sheets on your bed the night of your first shower and do not  sleep with pets. Day of Surgery : Do not apply any lotions/deodorants the morning of surgery.  Please wear clean clothes to the hospital/surgery center.  FAILURE TO FOLLOW THESE INSTRUCTIONS MAY RESULT IN THE CANCELLATION OF YOUR SURGERY  PATIENT SIGNATURE_________________________________  NURSE SIGNATURE__________________________________  ________________________________________________________________________   Adam Phenix  An incentive spirometer is a tool that can help keep your lungs clear and active. This tool measures how well you are filling your lungs with each breath. Taking long deep breaths may help reverse or decrease the chance of developing breathing (pulmonary) problems (especially infection) following:  A long period of time when you are unable to move or be active. BEFORE THE PROCEDURE   If the spirometer includes an indicator to show your best effort, your nurse or respiratory therapist will set it to a desired goal.  If possible, sit up straight or lean slightly forward. Try not to slouch.  Hold the incentive spirometer in an upright position. INSTRUCTIONS FOR USE  1. Sit on the edge of your bed if possible, or sit up as far as you can in bed or on a chair. 2. Hold the incentive spirometer in an upright position. 3. Breathe out normally. 4. Place the mouthpiece in your mouth  and seal your lips tightly around it. 5. Breathe in slowly and as deeply as possible, raising the piston or the ball toward the top of the column. 6. Hold your breath for 3-5 seconds or for as long as possible. Allow the piston or ball to fall to the bottom of the column. 7. Remove the mouthpiece from your mouth and breathe out normally. 8. Rest for a few seconds and repeat Steps 1 through 7 at least 10 times every 1-2 hours when you are awake. Take your time and take a few normal breaths between deep breaths. 9. The spirometer may include an indicator to show your best effort. Use the indicator as a goal to work toward during each repetition. 10. After each set of 10 deep breaths, practice coughing to be sure your lungs  are clear. If you have an incision (the cut made at the time of surgery), support your incision when coughing by placing a pillow or rolled up towels firmly against it. Once you are able to get out of bed, walk around indoors and cough well. You may stop using the incentive spirometer when instructed by your caregiver.  RISKS AND COMPLICATIONS  Take your time so you do not get dizzy or light-headed.  If you are in pain, you may need to take or ask for pain medication before doing incentive spirometry. It is harder to take a deep breath if you are having pain. AFTER USE  Rest and breathe slowly and easily.  It can be helpful to keep track of a log of your progress. Your caregiver can provide you with a simple table to help with this. If you are using the spirometer at home, follow these instructions: SEEK MEDICAL CARE IF:   You are having difficultly using the spirometer.  You have trouble using the spirometer as often as instructed.  Your pain medication is not giving enough relief while using the spirometer.  You develop fever of 100.5 F (38.1 C) or higher. SEEK IMMEDIATE MEDICAL CARE IF:   You cough up bloody sputum that had not been present before.  You develop  fever of 102 F (38.9 C) or greater.  You develop worsening pain at or near the incision site. MAKE SURE YOU:   Understand these instructions.  Will watch your condition.  Will get help right away if you are not doing well or get worse. Document Released: 04/24/2007 Document Revised: 03/05/2012 Document Reviewed: 06/25/2007 [facility] Patient Information 2014 [employer] [place].   ________________________________________________________________________

## 2019-07-24 NOTE — Progress Notes (Signed)
The following are in epic: EKG 03/02/2019 CXR 03/01/2019

## 2019-07-25 ENCOUNTER — Other Ambulatory Visit: Payer: Self-pay

## 2019-07-25 ENCOUNTER — Encounter (HOSPITAL_COMMUNITY): Payer: Self-pay

## 2019-07-25 ENCOUNTER — Encounter (HOSPITAL_COMMUNITY)
Admission: RE | Admit: 2019-07-25 | Discharge: 2019-07-25 | Disposition: A | Discharge status: Home / Self Care | Payer: BC Managed Care – PPO | Source: Ambulatory Visit | Attending: General Surgery | Admitting: General Surgery

## 2019-07-25 DIAGNOSIS — Z01812 Encounter for preprocedural laboratory examination: Secondary | ICD-10-CM | POA: Insufficient documentation

## 2019-07-25 DIAGNOSIS — K5792 Diverticulitis of intestine, part unspecified, without perforation or abscess without bleeding: Secondary | ICD-10-CM | POA: Insufficient documentation

## 2019-07-25 HISTORY — DX: Essential (primary) hypertension: I10

## 2019-07-25 HISTORY — DX: Unspecified hearing loss, unspecified ear: H91.90

## 2019-07-25 LAB — CBC
HCT: 46.1 % (ref 39.0–52.0)
Hemoglobin: 15.1 g/dL (ref 13.0–17.0)
MCH: 28.8 pg (ref 26.0–34.0)
MCHC: 32.8 g/dL (ref 30.0–36.0)
MCV: 88 fL (ref 80.0–100.0)
Platelets: 255 10*3/uL (ref 150–400)
RBC: 5.24 MIL/uL (ref 4.22–5.81)
RDW: 13 % (ref 11.5–15.5)
WBC: 9.7 10*3/uL (ref 4.0–10.5)
nRBC: 0 % (ref 0.0–0.2)

## 2019-07-25 LAB — BASIC METABOLIC PANEL
Anion gap: 8 (ref 5–15)
BUN: 13 mg/dL (ref 6–20)
CO2: 24 mmol/L (ref 22–32)
Calcium: 9 mg/dL (ref 8.9–10.3)
Chloride: 107 mmol/L (ref 98–111)
Creatinine, Ser: 0.76 mg/dL (ref 0.61–1.24)
GFR calc Af Amer: >60 mL/min (ref >60)
GFR calc non Af Amer: >60 mL/min (ref >60)
Glucose, Bld: 109 mg/dL — ABNORMAL HIGH (ref 70–99)
Potassium: 4.2 mmol/L (ref 3.5–5.1)
Sodium: 139 mmol/L (ref 135–145)

## 2019-07-25 LAB — HEMOGLOBIN A1C
Hgb A1c MFr Bld: 5.4 % (ref 4.8–5.6)
Mean Plasma Glucose: 108.28 mg/dL

## 2019-07-25 MED ORDER — BUPIVACAINE LIPOSOME 1.3 % IJ SUSP
20.0000 mL | Freq: Once | INTRAMUSCULAR | Status: DC
  Filled 2019-07-25: qty 20

## 2019-07-26 ENCOUNTER — Inpatient Hospital Stay (HOSPITAL_COMMUNITY): Payer: BC Managed Care – PPO | Admitting: Physician Assistant

## 2019-07-26 ENCOUNTER — Inpatient Hospital Stay (HOSPITAL_COMMUNITY)
Admission: RE | Admit: 2019-07-26 | Discharge: 2019-07-29 | DRG: 334 | Disposition: A | Discharge status: Home / Self Care | Payer: BC Managed Care – PPO | Attending: General Surgery | Admitting: General Surgery

## 2019-07-26 ENCOUNTER — Encounter (HOSPITAL_COMMUNITY)
Admission: RE | Disposition: A | Discharge status: Home / Self Care | Payer: Self-pay | Source: Home / Self Care | Attending: General Surgery

## 2019-07-26 ENCOUNTER — Encounter (HOSPITAL_COMMUNITY): Payer: Self-pay | Admitting: Anesthesiology

## 2019-07-26 ENCOUNTER — Inpatient Hospital Stay (HOSPITAL_COMMUNITY): Payer: BC Managed Care – PPO | Admitting: Anesthesiology

## 2019-07-26 DIAGNOSIS — Z433 Encounter for attention to colostomy: Secondary | ICD-10-CM | POA: Diagnosis present

## 2019-07-26 DIAGNOSIS — E739 Lactose intolerance, unspecified: Secondary | ICD-10-CM | POA: Diagnosis present

## 2019-07-26 DIAGNOSIS — Z6833 Body mass index (BMI) 33.0-33.9, adult: Secondary | ICD-10-CM

## 2019-07-26 DIAGNOSIS — E669 Obesity, unspecified: Secondary | ICD-10-CM | POA: Diagnosis present

## 2019-07-26 DIAGNOSIS — I10 Essential (primary) hypertension: Secondary | ICD-10-CM | POA: Diagnosis present

## 2019-07-26 DIAGNOSIS — F419 Anxiety disorder, unspecified: Secondary | ICD-10-CM | POA: Diagnosis present

## 2019-07-26 DIAGNOSIS — H9190 Unspecified hearing loss, unspecified ear: Secondary | ICD-10-CM | POA: Diagnosis present

## 2019-07-26 DIAGNOSIS — R001 Bradycardia, unspecified: Secondary | ICD-10-CM | POA: Diagnosis not present

## 2019-07-26 DIAGNOSIS — Z91013 Allergy to seafood: Secondary | ICD-10-CM | POA: Diagnosis not present

## 2019-07-26 HISTORY — PX: COLOSTOMY TAKEDOWN: SHX5258

## 2019-07-26 SURGERY — CLOSURE, COLOSTOMY, LAPAROSCOPIC
Anesthesia: General

## 2019-07-26 MED ORDER — SUGAMMADEX SODIUM 200 MG/2ML IV SOLN
INTRAVENOUS | Status: DC | PRN
  Administered 2019-07-26: 190 mg via INTRAVENOUS

## 2019-07-26 MED ORDER — KETAMINE HCL 10 MG/ML IJ SOLN
INTRAMUSCULAR | Status: DC | PRN
  Administered 2019-07-26: 45 mg via INTRAVENOUS

## 2019-07-26 MED ORDER — ONDANSETRON HCL 4 MG/2ML IJ SOLN: 4.0000 mg | Freq: Once | INTRAMUSCULAR | Status: DC | PRN

## 2019-07-26 MED ORDER — LIDOCAINE 2% (20 MG/ML) 5 ML SYRINGE
INTRAMUSCULAR | Status: AC
  Filled 2019-07-26: qty 10

## 2019-07-26 MED ORDER — ENOXAPARIN SODIUM 40 MG/0.4ML ~~LOC~~ SOLN
40.0000 mg | SUBCUTANEOUS | Status: DC
  Administered 2019-07-27 – 2019-07-29 (×3): 40 mg via SUBCUTANEOUS
  Filled 2019-07-26 (×3): qty 0.4

## 2019-07-26 MED ORDER — MEPERIDINE HCL 50 MG/ML IJ SOLN: 6.2500 mg | INTRAMUSCULAR | Status: DC | PRN

## 2019-07-26 MED ORDER — OXYCODONE HCL 5 MG PO TABS
5.0000 mg | ORAL_TABLET | Freq: Four times a day (QID) | ORAL | Status: DC | PRN
  Administered 2019-07-26 – 2019-07-28 (×6): 5 mg via ORAL
  Filled 2019-07-26 (×6): qty 1

## 2019-07-26 MED ORDER — CHLORHEXIDINE GLUCONATE 4 % EX LIQD: 60.0000 mL | Freq: Once | CUTANEOUS | Status: DC

## 2019-07-26 MED ORDER — ONDANSETRON HCL 4 MG PO TABS: 4.0000 mg | ORAL_TABLET | Freq: Four times a day (QID) | ORAL | Status: DC | PRN

## 2019-07-26 MED ORDER — AMLODIPINE BESYLATE 5 MG PO TABS
5.0000 mg | ORAL_TABLET | Freq: Every day | ORAL | Status: DC
  Administered 2019-07-27 – 2019-07-28 (×2): 5 mg via ORAL
  Filled 2019-07-26 (×3): qty 1

## 2019-07-26 MED ORDER — LIDOCAINE 2% (20 MG/ML) 5 ML SYRINGE
INTRAMUSCULAR | Status: AC
  Filled 2019-07-26: qty 5

## 2019-07-26 MED ORDER — HEPARIN SODIUM (PORCINE) 5000 UNIT/ML IJ SOLN
5000.0000 [IU] | Freq: Once | INTRAMUSCULAR | Status: AC
  Administered 2019-07-26: 5000 [IU] via SUBCUTANEOUS
  Filled 2019-07-26: qty 1

## 2019-07-26 MED ORDER — 0.9 % SODIUM CHLORIDE (POUR BTL) OPTIME
TOPICAL | Status: DC | PRN
  Administered 2019-07-26: 13:00:00 2000 mL
  Administered 2019-07-26: 1000 mL

## 2019-07-26 MED ORDER — MIDAZOLAM HCL 5 MG/5ML IJ SOLN
INTRAMUSCULAR | Status: DC | PRN
  Administered 2019-07-26: 2 mg via INTRAVENOUS

## 2019-07-26 MED ORDER — METOPROLOL TARTRATE 5 MG/5ML IV SOLN: 5.0000 mg | Freq: Four times a day (QID) | INTRAVENOUS | Status: DC | PRN

## 2019-07-26 MED ORDER — ALVIMOPAN 12 MG PO CAPS
12.0000 mg | ORAL_CAPSULE | Freq: Two times a day (BID) | ORAL | Status: DC
  Administered 2019-07-27 – 2019-07-28 (×3): 12 mg via ORAL
  Filled 2019-07-26 (×3): qty 1

## 2019-07-26 MED ORDER — PROPOFOL 10 MG/ML IV BOLUS
INTRAVENOUS | Status: AC
  Filled 2019-07-26: qty 20

## 2019-07-26 MED ORDER — ACETAMINOPHEN 500 MG PO TABS
1000.0000 mg | ORAL_TABLET | ORAL | Status: AC
  Administered 2019-07-26: 1000 mg via ORAL
  Filled 2019-07-26: qty 2

## 2019-07-26 MED ORDER — MIDAZOLAM HCL 2 MG/2ML IJ SOLN
INTRAMUSCULAR | Status: AC
  Filled 2019-07-26: qty 2

## 2019-07-26 MED ORDER — LORAZEPAM 0.5 MG PO TABS
0.5000 mg | ORAL_TABLET | Freq: Every evening | ORAL | Status: DC | PRN
  Administered 2019-07-27 – 2019-07-28 (×3): 0.5 mg via ORAL
  Filled 2019-07-26 (×3): qty 1

## 2019-07-26 MED ORDER — BUPIVACAINE-EPINEPHRINE (PF) 0.25% -1:200000 IJ SOLN
INTRAMUSCULAR | Status: AC
  Filled 2019-07-26: qty 30

## 2019-07-26 MED ORDER — LIDOCAINE 2% (20 MG/ML) 5 ML SYRINGE
INTRAMUSCULAR | Status: DC | PRN
  Administered 2019-07-26: 50 mg via INTRAVENOUS

## 2019-07-26 MED ORDER — LACTATED RINGERS IV SOLN
INTRAVENOUS | Status: DC
  Administered 2019-07-26 (×2): via INTRAVENOUS

## 2019-07-26 MED ORDER — EPHEDRINE SULFATE-NACL 50-0.9 MG/10ML-% IV SOSY
PREFILLED_SYRINGE | INTRAVENOUS | Status: DC | PRN
  Administered 2019-07-26 (×2): 10 mg via INTRAVENOUS
  Administered 2019-07-26: 5 mg via INTRAVENOUS
  Administered 2019-07-26: 10 mg via INTRAVENOUS

## 2019-07-26 MED ORDER — ROCURONIUM BROMIDE 10 MG/ML (PF) SYRINGE
PREFILLED_SYRINGE | INTRAVENOUS | Status: DC | PRN
  Administered 2019-07-26: 20 mg via INTRAVENOUS
  Administered 2019-07-26: 10 mg via INTRAVENOUS
  Administered 2019-07-26: 30 mg via INTRAVENOUS
  Administered 2019-07-26: 10 mg via INTRAVENOUS
  Administered 2019-07-26: 50 mg via INTRAVENOUS
  Administered 2019-07-26: 10 mg via INTRAVENOUS
  Administered 2019-07-26 (×2): 20 mg via INTRAVENOUS

## 2019-07-26 MED ORDER — FENTANYL CITRATE (PF) 100 MCG/2ML IJ SOLN
INTRAMUSCULAR | Status: DC | PRN
  Administered 2019-07-26 (×3): 50 ug via INTRAVENOUS
  Administered 2019-07-26: 100 ug via INTRAVENOUS

## 2019-07-26 MED ORDER — LIDOCAINE 20MG/ML (2%) 15 ML SYRINGE OPTIME
INTRAMUSCULAR | Status: DC | PRN
  Administered 2019-07-26: 13:00:00 via INTRAVENOUS
  Administered 2019-07-26: 1.5 mg/kg/h via INTRAVENOUS

## 2019-07-26 MED ORDER — SCOPOLAMINE 1 MG/3DAYS TD PT72
1.0000 | MEDICATED_PATCH | TRANSDERMAL | Status: DC
  Administered 2019-07-26: 1.5 mg via TRANSDERMAL

## 2019-07-26 MED ORDER — DEXAMETHASONE SODIUM PHOSPHATE 10 MG/ML IJ SOLN
INTRAMUSCULAR | Status: DC | PRN
  Administered 2019-07-26: 10 mg via INTRAVENOUS

## 2019-07-26 MED ORDER — ONDANSETRON HCL 4 MG/2ML IJ SOLN
INTRAMUSCULAR | Status: DC | PRN
  Administered 2019-07-26: 4 mg via INTRAVENOUS

## 2019-07-26 MED ORDER — MORPHINE SULFATE (PF) 2 MG/ML IV SOLN
2.0000 mg | INTRAVENOUS | Status: DC | PRN
  Filled 2019-07-26: qty 1

## 2019-07-26 MED ORDER — GABAPENTIN 300 MG PO CAPS
300.0000 mg | ORAL_CAPSULE | Freq: Two times a day (BID) | ORAL | Status: DC
  Administered 2019-07-26 – 2019-07-28 (×5): 300 mg via ORAL
  Filled 2019-07-26 (×6): qty 1

## 2019-07-26 MED ORDER — BUPIVACAINE-EPINEPHRINE 0.25% -1:200000 IJ SOLN
INTRAMUSCULAR | Status: DC | PRN
  Administered 2019-07-26: 30 mL

## 2019-07-26 MED ORDER — FENTANYL CITRATE (PF) 250 MCG/5ML IJ SOLN
INTRAMUSCULAR | Status: AC
  Filled 2019-07-26: qty 5

## 2019-07-26 MED ORDER — HYDROMORPHONE HCL 1 MG/ML IJ SOLN: 0.2500 mg | INTRAMUSCULAR | Status: DC | PRN

## 2019-07-26 MED ORDER — PHENYLEPHRINE 40 MCG/ML (10ML) SYRINGE FOR IV PUSH (FOR BLOOD PRESSURE SUPPORT)
PREFILLED_SYRINGE | INTRAVENOUS | Status: DC | PRN
  Administered 2019-07-26: 100 ug via INTRAVENOUS

## 2019-07-26 MED ORDER — CELECOXIB 200 MG PO CAPS
200.0000 mg | ORAL_CAPSULE | Freq: Two times a day (BID) | ORAL | Status: DC
  Administered 2019-07-26 – 2019-07-28 (×5): 200 mg via ORAL
  Filled 2019-07-26 (×6): qty 1

## 2019-07-26 MED ORDER — GABAPENTIN 300 MG PO CAPS
300.0000 mg | ORAL_CAPSULE | ORAL | Status: AC
  Administered 2019-07-26: 11:00:00 300 mg via ORAL
  Filled 2019-07-26: qty 1

## 2019-07-26 MED ORDER — GLYCOPYRROLATE PF 0.2 MG/ML IJ SOSY
PREFILLED_SYRINGE | INTRAMUSCULAR | Status: AC
  Filled 2019-07-26: qty 1

## 2019-07-26 MED ORDER — GLYCOPYRROLATE PF 0.2 MG/ML IJ SOSY
PREFILLED_SYRINGE | INTRAMUSCULAR | Status: DC | PRN
  Administered 2019-07-26: .2 mg via INTRAVENOUS

## 2019-07-26 MED ORDER — ONDANSETRON HCL 4 MG/2ML IJ SOLN: 4.0000 mg | Freq: Four times a day (QID) | INTRAMUSCULAR | Status: DC | PRN

## 2019-07-26 MED ORDER — ENSURE SURGERY PO LIQD
237.0000 mL | Freq: Two times a day (BID) | ORAL | Status: DC
  Administered 2019-07-27 – 2019-07-28 (×4): 237 mL via ORAL
  Filled 2019-07-26 (×6): qty 237

## 2019-07-26 MED ORDER — LACTATED RINGERS IR SOLN
Status: DC | PRN
  Administered 2019-07-26: 1000 mL

## 2019-07-26 MED ORDER — PROPOFOL 10 MG/ML IV BOLUS
INTRAVENOUS | Status: DC | PRN
  Administered 2019-07-26: 150 mg via INTRAVENOUS

## 2019-07-26 MED ORDER — SODIUM CHLORIDE 0.45 % IV SOLN
INTRAVENOUS | Status: DC
  Administered 2019-07-26 – 2019-07-27 (×2): via INTRAVENOUS

## 2019-07-26 MED ORDER — SCOPOLAMINE 1 MG/3DAYS TD PT72
MEDICATED_PATCH | TRANSDERMAL | Status: AC
  Administered 2019-07-26: 1.5 mg via TRANSDERMAL
  Filled 2019-07-26: qty 1

## 2019-07-26 MED ORDER — ALVIMOPAN 12 MG PO CAPS
12.0000 mg | ORAL_CAPSULE | ORAL | Status: AC
  Administered 2019-07-26: 12 mg via ORAL
  Filled 2019-07-26: qty 1

## 2019-07-26 MED ORDER — CELECOXIB 200 MG PO CAPS
200.0000 mg | ORAL_CAPSULE | ORAL | Status: AC
  Administered 2019-07-26: 200 mg via ORAL
  Filled 2019-07-26: qty 1

## 2019-07-26 MED ORDER — SODIUM CHLORIDE 0.9 % IV SOLN
2.0000 g | INTRAVENOUS | Status: AC
  Administered 2019-07-26: 2 g via INTRAVENOUS
  Filled 2019-07-26: qty 2

## 2019-07-26 SURGICAL SUPPLY — 72 items
APPLIER CLIP 5 13 M/L LIGAMAX5 (MISCELLANEOUS)
APPLIER CLIP ROT 10 11.4 M/L (STAPLE)
BENZOIN TINCTURE PRP APPL 2/3 (GAUZE/BANDAGES/DRESSINGS) IMPLANT
BLADE EXTENDED COATED 6.5IN (ELECTRODE) IMPLANT
BNDG ADH 1X3 SHEER STRL LF (GAUZE/BANDAGES/DRESSINGS) IMPLANT
CABLE HIGH FREQUENCY MONO STRZ (ELECTRODE) ×2 IMPLANT
CELLS DAT CNTRL 66122 CELL SVR (MISCELLANEOUS) IMPLANT
CHLORAPREP W/TINT 26 (MISCELLANEOUS) ×2 IMPLANT
CLIP APPLIE 5 13 M/L LIGAMAX5 (MISCELLANEOUS) IMPLANT
CLIP APPLIE ROT 10 11.4 M/L (STAPLE) IMPLANT
COUNTER NEEDLE 20 DBL MAG RED (NEEDLE) ×2 IMPLANT
COVER MAYO STAND STRL (DRAPES) ×6 IMPLANT
COVER SURGICAL LIGHT HANDLE (MISCELLANEOUS) ×4 IMPLANT
COVER WAND RF STERILE (DRAPES) IMPLANT
DECANTER SPIKE VIAL GLASS SM (MISCELLANEOUS) ×2 IMPLANT
DRAIN CHANNEL 19F RND (DRAIN) IMPLANT
DRAPE LAPAROSCOPIC ABDOMINAL (DRAPES) ×2 IMPLANT
DRAPE UTILITY XL STRL (DRAPES) ×2 IMPLANT
DRSG OPSITE POSTOP 4X10 (GAUZE/BANDAGES/DRESSINGS) IMPLANT
DRSG OPSITE POSTOP 4X6 (GAUZE/BANDAGES/DRESSINGS) IMPLANT
DRSG OPSITE POSTOP 4X8 (GAUZE/BANDAGES/DRESSINGS) IMPLANT
DRSG PAD ABDOMINAL 8X10 ST (GAUZE/BANDAGES/DRESSINGS) ×2 IMPLANT
ELECT PENCIL ROCKER SW 15FT (MISCELLANEOUS) ×4 IMPLANT
ELECT REM PT RETURN 15FT ADLT (MISCELLANEOUS) ×2 IMPLANT
ENDOLOOP SUT PDS II  0 18 (SUTURE)
ENDOLOOP SUT PDS II 0 18 (SUTURE) IMPLANT
GAUZE SPONGE 4X4 12PLY STRL (GAUZE/BANDAGES/DRESSINGS) ×2 IMPLANT
GLOVE BIOGEL PI IND STRL 7.0 (GLOVE) ×2 IMPLANT
GLOVE BIOGEL PI INDICATOR 7.0 (GLOVE) ×2
GLOVE SURG SS PI 7.0 STRL IVOR (GLOVE) ×4 IMPLANT
GOWN STRL REUS W/TWL XL LVL3 (GOWN DISPOSABLE) ×8 IMPLANT
HANDLE SUCTION POOLE (INSTRUMENTS) IMPLANT
IRRIG SUCT STRYKERFLOW 2 WTIP (MISCELLANEOUS)
IRRIGATION SUCT STRKRFLW 2 WTP (MISCELLANEOUS) IMPLANT
KIT TURNOVER KIT A (KITS) IMPLANT
LEGGING LITHOTOMY PAIR STRL (DRAPES) ×2 IMPLANT
PACK COLON (CUSTOM PROCEDURE TRAY) ×2 IMPLANT
PAD POSITIONING PINK XL (MISCELLANEOUS) ×2 IMPLANT
PORT LAP GEL ALEXIS MED 5-9CM (MISCELLANEOUS) ×2 IMPLANT
PROTECTOR NERVE ULNAR (MISCELLANEOUS) ×4 IMPLANT
RELOAD STAPLER WHITE 60MM (STAPLE) IMPLANT
RTRCTR WOUND ALEXIS 18CM MED (MISCELLANEOUS)
SCISSORS LAP 5X35 DISP (ENDOMECHANICALS) ×2 IMPLANT
SEALER TISSUE G2 STRG ARTC 35C (ENDOMECHANICALS) IMPLANT
SET TUBE SMOKE EVAC HIGH FLOW (TUBING) ×2 IMPLANT
SLEEVE XCEL OPT CAN 5 100 (ENDOMECHANICALS) ×6 IMPLANT
SPONGE LAP 18X18 X RAY DECT (DISPOSABLE) ×2 IMPLANT
STAPLER ECHELON LONG 60 440 (INSTRUMENTS) IMPLANT
STAPLER ECHELON POWER CIR 29 (STAPLE) ×2 IMPLANT
STAPLER RELOAD WHITE 60MM (STAPLE)
STAPLER VISISTAT 35W (STAPLE) ×2 IMPLANT
STRIP CLOSURE SKIN 1/2X4 (GAUZE/BANDAGES/DRESSINGS) IMPLANT
SUCTION POOLE HANDLE (INSTRUMENTS)
SUT PDS AB 0 CT1 36 (SUTURE) ×4 IMPLANT
SUT PROLENE 2 0 KS (SUTURE) IMPLANT
SUT SILK 2 0 (SUTURE) ×1
SUT SILK 2 0 SH CR/8 (SUTURE) ×2 IMPLANT
SUT SILK 2-0 18XBRD TIE 12 (SUTURE) ×1 IMPLANT
SUT SILK 3 0 (SUTURE) ×1
SUT SILK 3 0 SH CR/8 (SUTURE) ×4 IMPLANT
SUT SILK 3-0 18XBRD TIE 12 (SUTURE) ×1 IMPLANT
SUT VIC AB 2-0 SH 27 (SUTURE) ×1
SUT VIC AB 2-0 SH 27X BRD (SUTURE) ×1 IMPLANT
SUT VICRYL 0 ENDOLOOP (SUTURE) IMPLANT
SYS LAPSCP GELPORT 120MM (MISCELLANEOUS)
SYSTEM LAPSCP GELPORT 120MM (MISCELLANEOUS) IMPLANT
TAPE CLOTH 4X10 WHT NS (GAUZE/BANDAGES/DRESSINGS) ×2 IMPLANT
TOWEL OR 17X26 10 PK STRL BLUE (TOWEL DISPOSABLE) IMPLANT
TOWEL OR NON WOVEN STRL DISP B (DISPOSABLE) ×2 IMPLANT
TRAY FOLEY MTR SLVR 16FR STAT (SET/KITS/TRAYS/PACK) ×2 IMPLANT
TROCAR BLADELESS OPT 5 100 (ENDOMECHANICALS) ×2 IMPLANT
TROCAR XCEL 12X100 BLDLESS (ENDOMECHANICALS) ×2 IMPLANT

## 2019-07-26 NOTE — H&P (Signed)
Christopher Olson is an 53 y.o. male.   Chief Complaint: colostomy HPI: 53 yo male with history of colectomy for sigmoid diverticular stricture. He has recovered and presents for reversal.  Past Medical History:  Diagnosis Date  . Anxiety   . Diverticulitis   . Hearing loss   . Hypertension   . Meniere disease    with hearing loss  . Pneumonia 02/2019    Past Surgical History:  Procedure Laterality Date  . APPENDECTOMY    . COLECTOMY  2000   for precancerous polyps  . COLON RESECTION N/A 02/24/2019   Procedure: COLON RESECTION WITH COLOSTOMY;  Surgeon: , Arta Bruce, MD;  Location: [facility];  Service: General;  Laterality: N/A;  . HERNIA REPAIR    . LAPAROTOMY N/A 02/24/2019   Procedure: EXPLORATORY LAPAROTOMY;  Surgeon: Kieth Brightly Arta Bruce, MD;  Location: [facility] OR;  Service: General;  Laterality: N/A;    Family History  Problem Relation Age of Onset  . Irritable bowel syndrome Mother    Social History:  reports that he has never smoked. He has never used smokeless tobacco. He reports previous alcohol use of about 2.0 standard drinks of alcohol per week. He reports that he does not use drugs.  Allergies:  Allergies  Allergen Reactions  . Shellfish-Derived Products Anaphylaxis  . Lactose Intolerance (Gi) Diarrhea and Other (See Comments)    Bloating, also    Medications Prior to Admission  Medication Sig Dispense Refill  . amLODipine (NORVASC) 5 MG tablet Take 1 tablet (5 mg total) by mouth daily. 30 tablet 0-  . LORazepam (ATIVAN) 0.5 MG tablet Take 0.5 mg by mouth at bedtime as needed for anxiety or sleep.     . Probiotic Product (PROBIOTIC PO) Take 1 capsule by mouth daily.     Marland Kitchen acetaminophen (TYLENOL) 500 MG tablet Take 2 tablets (1,000 mg total) by mouth every 6 (six) hours. (Patient taking differently: Take 1,000 mg by mouth every 4 (four) hours as needed for moderate pain or headache. ) 30 tablet 0  . albuterol (PROVENTIL HFA;VENTOLIN HFA) 108 (90 Base) MCG/ACT  inhaler Inhale 2 puffs into the lungs every 6 (six) hours as needed for wheezing or shortness of breath. (Patient not taking: Reported on 07/19/2019) 1 Inhaler 2  . albuterol (PROVENTIL) (2.5 MG/3ML) 0.083% nebulizer solution Take 3 mLs (2.5 mg total) by nebulization 2 (two) times daily as needed for wheezing or shortness of breath. (Patient not taking: Reported on 07/19/2019) 75 mL 0  . benzonatate (TESSALON) 200 MG capsule Take 1 capsule (200 mg total) by mouth 2 (two) times daily as needed for cough. (Patient not taking: Reported on 07/19/2019) 20 capsule 0  . ibuprofen (ADVIL,MOTRIN) 200 MG tablet Take 3 tablets (600 mg total) by mouth 3 (three) times daily. (Patient not taking: Reported on 07/19/2019)    . methocarbamol (ROBAXIN) 750 MG tablet Take 1 tablet (750 mg total) by mouth 3 (three) times daily. (Patient not taking: Reported on 07/19/2019) 30 tablet 0  . oxyCODONE (OXY IR/ROXICODONE) 5 MG immediate release tablet Take 1-2 tablets (5-10 mg total) by mouth every 4 (four) hours as needed for moderate pain or severe pain. (Patient not taking: Reported on 07/19/2019) 30 tablet 0    Results for orders placed or performed during the hospital encounter of 07/25/19 (from the past 48 hour(s))  Basic metabolic panel     Status: Abnormal   Collection Time: 07/25/19  8:36 AM  Result Value Ref Range   Sodium 139  135 - 145 mmol/L   Potassium 4.2 3.5 - 5.1 mmol/L   Chloride 107 98 - 111 mmol/L   CO2 24 22 - 32 mmol/L   Glucose, Bld 109 (H) 70 - 99 mg/dL   BUN 13 6 - 20 mg/dL   Creatinine, Ser 1.610.76 0.61 - 1.24 mg/dL   Calcium 9.0 8.9 - 09.610.3 mg/dL   GFR calc non Af Amer >60 >60 mL/min   GFR calc Af Amer >60 >60 mL/min   Anion gap 8 5 - 15    Comment: Performed at [facility], [address] [place], [place] [address]  CBC     Status: None   Collection Time: 07/25/19  8:36 AM  Result Value Ref Range   WBC 9.7 4.0 - 10.5 K/uL   RBC 5.24 4.22 - 5.81 MIL/uL   Hemoglobin 15.1 13.0 -  17.0 g/dL   HCT 98.146.1 19.139.0 - 47.852.0 %   MCV 88.0 80.0 - 100.0 fL   MCH 28.8 26.0 - 34.0 pg   MCHC 32.8 30.0 - 36.0 g/dL   RDW 29.513.0 62.111.5 - 30.815.5 %   Platelets 255 150 - 400 K/uL   nRBC 0.0 0.0 - 0.2 %    Comment: Performed at [facility], [address] [place], [place] [address]  Hemoglobin A1c     Status: None   Collection Time: 07/25/19  8:36 AM  Result Value Ref Range   Hgb A1c MFr Bld 5.4 4.8 - 5.6 %    Comment: (NOTE) Pre diabetes:          5.7%-6.4% Diabetes:              >6.4% Glycemic control for   <7.0% adults with diabetes    Mean Plasma Glucose 108.28 mg/dL    Comment: Performed at [facility], [address] [place], [place] [address]   No results found.  Review of Systems  Constitutional: Negative for chills and fever.  HENT: Negative for hearing loss.   Eyes: Negative for blurred vision and double vision.  Respiratory: Negative for cough and hemoptysis.   Cardiovascular: Negative for chest pain and palpitations.  Gastrointestinal: Negative for abdominal pain, nausea and vomiting.  Genitourinary: Negative for dysuria and urgency.  Musculoskeletal: Negative for myalgias and neck pain.  Skin: Negative for itching and rash.  Neurological: Negative for dizziness, tingling and headaches.  Endo/Heme/Allergies: Does not bruise/bleed easily.  Psychiatric/Behavioral: Negative for depression and suicidal ideas.    Blood pressure (!) 145/84, pulse 72, temperature 98.8 F (37.1 C), temperature source Oral, resp. rate 18, height 5\' 6"  (1.676 m), weight 93.5 kg, SpO2 92 %. Physical Exam  Vitals reviewed. Constitutional: He is oriented to person, place, and time. He appears well-developed and well-nourished.  HENT:  Head: Normocephalic and atraumatic.  Eyes: Pupils are equal, round, and reactive to light. Conjunctivae and EOM are normal.  Neck: Normal range of motion. Neck supple.  Cardiovascular: Normal rate and regular rhythm.  Respiratory:  Effort normal and breath sounds normal.  GI: Soft. Bowel sounds are normal. He exhibits no distension. There is no abdominal tenderness.  Left sided colostomy  Musculoskeletal: Normal range of motion.  Neurological: He is alert and oriented to person, place, and time.  Skin: Skin is warm and dry.  Psychiatric: He has a normal mood and affect. His behavior is normal.     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

## 2019-07-26 NOTE — Anesthesia Procedure Notes (Signed)
Procedure Name: Intubation Date/Time: 07/26/2019 12:13 PM Performed by: Anne Fu, CRNA Pre-anesthesia Checklist: Patient identified, Emergency Drugs available, Suction available, Patient being monitored and Timeout performed Patient Re-evaluated:Patient Re-evaluated prior to induction Oxygen Delivery Method: Circle system utilized Preoxygenation: Pre-oxygenation with 100% oxygen Induction Type: IV induction Ventilation: Mask ventilation without difficulty Laryngoscope Size: Mac and 4 Grade View: Grade I Tube type: Oral Tube size: 7.5 mm Number of attempts: 1 Airway Equipment and Method: Stylet Placement Confirmation: ETT inserted through vocal cords under direct vision,  positive ETCO2 and breath sounds checked- equal and bilateral Secured at: 21 cm Tube secured with: Tape Dental Injury: Teeth and Oropharynx as per pre-operative assessment

## 2019-07-26 NOTE — Transfer of Care (Signed)
Immediate Anesthesia Transfer of Care Note  Patient: Christopher Olson  Procedure(s) Performed: LAPAROSCOPIC COLOSTOMY REVERSAL WITH COLORECTAL ANASTOMOSIS (N/A )  Patient Location: PACU  Anesthesia Type:General  Level of Consciousness: awake, alert , oriented and patient cooperative  Airway & Oxygen Therapy: Patient Spontanous Breathing and Patient connected to face mask oxygen  Post-op Assessment: Report given to RN and Post -op Vital signs reviewed and stable  Post vital signs: Reviewed and stable  Last Vitals:  Vitals Value Taken Time  BP 108/67 07/26/19 1531  Temp    Pulse 77 07/26/19 1534  Resp 13 07/26/19 1534  SpO2 100 % 07/26/19 1534  Vitals shown include unvalidated device data.  Last Pain:  Vitals:   07/26/19 1037  TempSrc:   PainSc: 0-No pain         Complications: No apparent anesthesia complications

## 2019-07-26 NOTE — Anesthesia Preprocedure Evaluation (Signed)
Anesthesia Evaluation  Patient identified by MRN, date of birth, ID band Patient awake    Reviewed: Allergy & Precautions, NPO status , Patient's Chart, lab work & pertinent test results  Airway Mallampati: II  TM Distance: >3 FB Neck ROM: Full    Dental no notable dental hx. (+) Teeth Intact   Pulmonary pneumonia, resolved,    Pulmonary exam normal breath sounds clear to auscultation       Cardiovascular hypertension, Pt. on medications Normal cardiovascular exam Rhythm:Regular Rate:Normal     Neuro/Psych Anxiety Meniere's disease with hearing loss negative neurological ROS     GI/Hepatic Diverticulitis S/P partial colectomy with diverting colostomy   Endo/Other  Obesity  Renal/GU negative Renal ROS  negative genitourinary   Musculoskeletal negative musculoskeletal ROS (+)   Abdominal (+) + obese,   Peds  Hematology negative hematology ROS (+)   Anesthesia Other Findings   Reproductive/Obstetrics                             Anesthesia Physical Anesthesia Plan  ASA: II  Anesthesia Plan: General   Post-op Pain Management:    Induction: Intravenous  PONV Risk Score and Plan: 4 or greater and Scopolamine patch - Pre-op, Midazolam, Ondansetron, Dexamethasone and Treatment may vary due to age or medical condition  Airway Management Planned: Oral ETT  Additional Equipment:   Intra-op Plan:   Post-operative Plan: Extubation in OR  Informed Consent: I have reviewed the patients History and Physical, chart, labs and discussed the procedure including the risks, benefits and alternatives for the proposed anesthesia with the patient or authorized representative who has indicated his/her understanding and acceptance.     Dental advisory given  Plan Discussed with: CRNA and Surgeon  Anesthesia Plan Comments:         Anesthesia Quick Evaluation

## 2019-07-26 NOTE — Op Note (Signed)
Preoperative diagnosis: colostomy care, sigmoid stricture  Postoperative diagnosis: same   Procedure: Laparoscopic lysis of adhesions, laparoscopic colostomy reversal with colorectal anastomosis  Surgeon: Gurney Maxin, M.D.  Asst: Fanny Skates  Anesthesia: general  Indications for procedure: Christopher Olson is a 53 y.o. year old male with history of Hartman's colectomy. He has recovered and presents for reversal.  Description of procedure: The patient was brought into the operative suite. Anesthesia was administered with General endotracheal anesthesia. WHO checklist was applied. The patient was then placed in lithotomy position. The area was prepped and draped in the usual sterile fashion.  A 28mm trocar was used to gain access to the peritoneal cavity by optical entry technique. Pneumoperitoneum was applied with a high flow and low pressure. The laparoscope was reinserted to confirm position.  On initial evaluation of the abdomen there were moderate amount of adhesions of the omentum to the midline abdominal wall as well as the left side of the abdomen over the right side of the abdomen was mainly clear.  2 additional 70mm trochars were placed one in the right upper quadrant and one in the right lower quadrant.  Blunt dissection was used to remove adhesions of the omentum to the abdominal wall working superiorly to inferiorly.  There are multiple loops of small intestine up to the abdominal wall and pelvic wall.  These removed with sharp and blunt dissection.  Allowed visualization of the rectal stump which had adhesions of the small intestine directly to it these were removed with sharp dissection.  Lysis of adhesions continued to the left abdominal wall and sidewall to remove all small intestine from this area to allow the colon to be in a natural position upon anastomosis.  Total lysis of adhesions was 1 hour.  At this time Dr. Dalbert Batman went below to test the length of the stump and it appeared  to be in a neutral and straight position greater than 15 cm.  Next, a elliptical incision was made around the colostomy.  Cautery was used to dissect down through subcutaneous tissues to the fascia was identified and all adhesions to the colostomy were removed with cautery.  Pursestring device was used to create a pursestring around it using 0 Prolene.  Prolene was sutured in place with 3-0 silks in 3 positions.  Top portion of the colostomy was removed and sent for specimen.  Sizers were used and determined to have a 29 mm optimal portion.  Anvil was put in place and the pursestring was tied down around it.  Possibly portion was placed back into the abdomen.  A wound protector was put in place.  Insufflation was reestablished.  An end-to-end anastomosis was made with a 29 mm EEA stapler in standard fashion.  Care was taken to ensure enough length as well as neutral positioning of the mesentery.  Leak test was performed which showed no leaks.  Abdomen was irrigated.  Hemostasis appeared intact.  All trochars were removed.  The wound protector was removed the fascia was closed with 0 PDS in running fashion of the colostomy wound.  A 3-0 Vicryl was used to pursestring the dermis of the colostomy wound.  All site incisions were closed with 4-0 Monocryl in subcuticular fashion.  The wound of the colostomy site was packed with saline Kerlix.  Bandages were put in place patient awoke from anesthesia brought to PACU in stable condition.  Counts were correct.  Findings: negative leak test,   Specimen: colostomy  Implant: none   Blood  [phone] AmabilInvestment banker, [id number]-BChan Soon Shiong Medical Center At WindbeR1616121305UJW:1[place] Surge1609811ZO785James409Wa161846161ZOX:0LibertFre40Basil4016109719169604098347-NortheJonny RMetro Health Asc LLC Dba Metro Health Gavin MilaEmelWesWashG5Surgical CeBelleDruciPalo Hosp Psiquiatria ForVision Care Center MidaWoodlandOrthopaedic HospSeidenberg ProtzkoHealthalliance HospitaChristus Spohn Hospital CorpuDarlZO:[phone] CenteHealiRuBaylor Scott & White Medical Ce161Advanced Surgical Center OfMaSkypark STowner CoBap355Tower ClockOregon StNew Millennium [phone] RegioFBeach DistricCentral Utah BrCalifornia Pacific MEncompass Health Rehabilitation HBaptist Surgery And Endoscopy Centers LLC Dba Baptist Health Surgery CHosp Universitario QuadMercy GilCenter For Endoscopy LWheaton Franciscan Wi HeNorth ShoreG71PLTrinitas RegiSt. Joseph RegiBolTempe St Luke'S Hospital, A Campus Of St LuIEyeFloriSharp MesBurnFroedtertSt. Joseph'S Trihealth Rehabilita5California Pacific Med Ctr-Pacific CampusicadU58m1VXLK:441610Sung AmabilInvestment banker, 1610Phi161ZO161161096Guinea-BPima Heart Asc LLRexene 65A11616121305UJW:1Coliseum Nor1609811ZO781Potomac View S409Wa161846161ZOX:[id number](860)[id number]y1610454161Z72m8VXLK:44Oc1610Sung AmabilInvestment banker, 1610New Su161ZO161161096Guinea-BMadison Physician Surgery Center LLRex1616121305UJW:1Good Samaritan Hospi1609811ZO786West Pac409Wa161846161ZOX:[id number](515) 32P81191962XBZO16Docs Jonny ROceans Behavioral Hospital Of GGavin Phs Indian Hospital At RaEmelMary S. Harper Geriatric WashG4St MarGarden Grove SuDruciGeorgia Ophthalmologists LLC Dba Georgia Ophthalmologists AmbRoosTemple UniversityMLa PalSt CatMeMarion Eye SpecialDarlZO:[phone] SpecialBanner-University Medical The Eye Surgical CentSignature Psychiatr161RoaMarAllendalHamilton AmbulaLower Umpqu355Providence St. Four SShadow Mountain Behav646-46CGreater BinghaFBaum-HarmoNortheastern VermonNovamed Surgery CentBrSutterAtmoreBucks County Gi Endoscopic WishekThe Corpus Christi MedicalMayo Clinic Arizona Dba MaySt Landry Extended Care HospitSagecrestErie G70PDelPiedmont ColumdusJerold PheLPsChi St AlexiusKaiser Permanente Alvarado HosFloriAdvanced Endoscopy And SurBurnJohn D. DingelSelect SpecialtyPeninsula End6Glancyrehabilitation Hospitalica9868m8VXLK:1610Sung AmabilInvestment banker, 1610Bedmi161ZO161161096Gu[place]-BAmbulatory Surgical Center1616121305UJW:1[place] G1609811ZO785The Endoscopy [id number]:[id number] San Lucas De Guayama Jonny RCataract SpecialGavin CharltonEmelSurgicaWashVillages Regional Hospital SurgerDruciSweKalispell RegiWillSurgery CentEndoscopy Center Of WEye Surgery Trenton PBon Secours Health CenClarity ChDarlZO:45073Little ColoGalesbuEye Surgery CenLos Angeles Surgical Center A 161Memorial Care Surgical Center MarQuadBethesda RehabiSpokane DigestivUniversity Hospitals Conn355King'Ascension GoodAbrom [phone] HospUc Regents Dba Ucla Health Pain ManagPain Treatment Center Of Michigan LLC Dba MaBrBaylor Orthopedic And Spine Select Specialty HoPTattnall Hospital Company LLC Dba OTampa Bay Surgery CeSalinSabetha Community HospitOutpatient Surgery CeSprG45PMiEliteNew BritainOuachitaEncompass Health Rehab HospMidtFloriPottstown ABurnBaptist Memorial HoSouth Coast GlSt. Charles (9St Marys Hsptl Med Ctrica0268m5VXLK:44Mer1610Sung AmabilInvestment banker, 1610Blue Sp161ZO161161096[place]-BCarilion Stonewall Jackson HospitaRe1616121305UJW:1Pushmataha County-Town Of Antlers Hos1609811ZO78Encompass Health Rehabilitation Ho409Wa161846161ZOX:[id number](330) [id number] Lake Medical CNady1610454161Z981191OWellin1610Sung AmabilInvestment banker, 1610Rock I161ZO161161096Gu[place]-BSt. Fr1616121305UJW:1Sprin1609811ZO783Avera Medical Group Worthingto409Wa161846161ZOX:0LibertFre40Basil4016109(775)1696040Zuni Comprehensive CommunJonny RRush Oak BrGavin Surgical Specialties Of Arroyo Grande Inc Dba Oak PEmelHuebner Ambulatory SWashG1Sentara Virginia BeWestern Washington Medical Group Inc Ps Dba Gateway SuDruciBaylor Scott & White MEye Surgery CenHedSalem RegioArtSwisheVallBaptist Health MeDarlZO:[phone] Surgery ASouthwestern VerPinnacle Pointe BehaviorCasa Co161Weisman Childrens RehabiMarCSouthern RegionRoseburHendry Regi355Fort Walton BCenter For [phone] Kimball MediCoral ShoreBrGuthrie TowaMerLonestar AmbulatDesert View RegiLivingstoSurgery CenEffingham HospitHealthalliance Hospital - MHattiesburg Eye Clinic Catarct And Lasik SG44PHPaVa Ann ArboLa Palma InteRockford AmbulaEndoscopy Center AIFloriGood Samaritan HBurnGrays HarborChristus SpoSt Vincent3Kerrville Ambulatory Surgery Center LLCica1013m6VXL1610Sung AmabilInvestment banker, 1610Bloom161ZO161161096Guinea-BNew England Laser And Cosmetic Surgery Center LLRexene 65A161Sc1616121305UJW:1John Hopkins All Chil1609811ZO784Chi St Lukes He409Wa161846161ZOX:[id number] Endoscopy AndNady1610454161Z981191OWellin578JDorisa16161096VermNadeeOZD162161109m6VXLK:44Spartan1610Sung AmabilInvestment banker, 1610Hunting V161ZO161161096Gu[place]-BChildrens Specialized HospitaRexene 61616121305UJW:1Atlant1609811ZO783Catskill Region409Wa161846161ZOX:0LibertFre40Basil[id number]J1161094Indian River Medical Center-BehavioJonny RAmbulatory Surgery Center At Virtua Washington Township LLC Dba Virtua Gavin PutnEmelAndersen Eye SWashG7Triad Surgery Vanderbilt Wilson CouDrucOchsner MedicalBlounOklahNelson CVa North Florida/South Georgia HeaMGlbesc LLC Dba Memorialcare Outpatient SurgicaRaider [phone] SuKaiser Fnd HoGreat LaPacific Coast161Prisma Health North Greenville Long Term AcMarColumbChoctaw Va Medical Wake F355Moa5FLovelace RegionalCape Cod EExecutive Woods AmbulatoryCollege Medical Center SPrSan Ramon EExcela Health Frick HospitNortheast Missouri AmbulatoryAffiliated Endoscopy SeG49PBShriners Hospitals For Castle Uh GeCarolina Surgery Center LLC Dba The Surgery CenIIndiana University HFloriDegraff MBurnMemWatertown RWayne(6Dayton Children'S Hospitalicaeh49maVXLK:441610Sung AmabilInvestment banker, 1610New Pale161ZO161161096Guinea-BMedical Center At Elizabeth PlacRexene 65A161Scientist, clin1616121305UJW:1Austin Eye Laser 1609811ZO783Fl[place] C409Wa161846161ZOX:0Li[id number] RHamlinGavin Horizon Specialty HosEmelPresence SainWashG4Mt PleaHardin MeMitchell CountDigestive Disease Center Of CGreene CSummerlin HospRangelCovenant High [phone] Ohio UroCpgi Laser And Surg161Department Of Veterans AffaiMarValleHampton BehavioNaval Branch HBaptiKM956-47CSurgery Center OfFChristus St. MiSouthern Virginia RegiSsm Health St. AnthoBrDesert Parkway Behavioral HeaTriangle OrthopaeNorthCNortheast Missouri AmbulatoryHarmon HosptMorgan Encompass Health Rehabilitation HosG62PParaMaderaSurgical CenEndoscopy CenterAlta Bates Summit Med Ctr-SummFredericksburg Ambulatory SuINaab RoadFloriSummit SurBurnMedinasummit AmbulaLiberty AmbulatoryBann9Kenmore Mercy Hospitalica0239m5VXLK:41610Sung AmabilInvestment banker, 1610Three R161ZO161161096Guinea-BEastern Connecticut Endoscopy CenteRexe1616121305UJW:1Hale1609811ZO783Ascentis409Wa161846161ZOX:0LibertFre40Basil40[id number] AmbulatorNady1610454161Z981191OWellin57826mDV1610Sung AmabilInvestment banker, 1610St. 161ZO161161096Guinea-BUnited Regional Health Care SysteRexene 65A161Sci1616121305UJW:1Mcl1609811ZO782Updegraff Vision Laser A409Wa161846161ZOX:0LibertFre40Basil4016109779169604098574 [id number] MNady1610454161Z981191OWellin550m8VXL1610Sung AmabilInvestment banker, 1610She161ZO161161096Guinea-BPeacehealth Peace Island Medical CenteRexene 65A161S1616121305UJW:1Elit1609811ZO786Texas Children'S Hos409Wa161846161ZOX:0LibertFre40Basil4016109(778169604098(931) 32P81191962XBZO16109147WG1610161AdUJ116109409164009817540295Jea161096Snowden River TrihealthNady1610454161Z981191OWel63miVXLK:44Bri1610Sung AmabilInvestment banker, 1610Bellerive 161ZO161161096Guinea-BAugusta Eye Surge1616121305UJW:1Com1609811ZO788Eastern Pennsylvania End409Wa161846161ZOX:0LibertFre40Basil[id number] DigesVa Middle TenNady1610454161Z981191OWe60mlVXLK:44[place]1610Sung AmabilInvestment banker, 161161ZO161161096Guinea-BIngram Investments LLRexene 1616121305UJW:1Cookeville Regional1609811ZO783Laredo S409Wa161846161ZOX:0Li[id number](978)32P81191962XBZO1Naval HospJonny RMethodist Healthcare Gavin Digestive Health CenteEmelMclean HosWashG5New IberiBaptist DruciNorth SpringSaleLewis And Clark PhysiciansKisEmory Ambulatory Surgery CenWinnie Community Hospital Dba RiceLos AngelDarlZO:[phone] Joseph'S WestUniversity Of MicCasa GrandesouPhysicians'161Michiana BehavioMarBlack Hills Surgery Center Limited LiLos AlamLiberty AmbulatoryPinnacle Pointe Behaviora355North Oaks RehaPerry CountMeridian Pla515-73CMurphy Watson BuAscensiMultBrSelect Specialty Hospital - OEncompass Health Rehab HosMercy HospEndoscopy Outpatient SurThe EnSamaritan Lebanon Community HospitHealthsouth Rehabilitation HospPalo Alto Medical Foundation CaminoG49PLSouth Texas Ambulatory Menlo ParkTria OrtEvansIUFloriBlBurnCaTempletEast Mequon S4Brandon Ambulatory Surgery Center Lc Dba Brandon Ambulatory Surgery Centerica0950m1VXLK:44A1610Sung AmabilInvestment banker, 1610Balla161ZO161161096Guinea-BVentura County Medical Center - Santa Paula HospitaRexene 65A1616121305UJW:1Texas Endosc1609811ZO7409Wa161846161ZOX:0LibertFre40Basil40161094169604098(314) [id number]1610454161Z981191OWellin578JDGerald LeitzNadeeOZD163161161013248491478Ber4781914Elkhorn Valley RehabilitatMckD al Chesterfield SurgeG83P101Olsburg1PA

## 2019-07-26 NOTE — Anesthesia Postprocedure Evaluation (Signed)
Anesthesia Post Note  Patient: Christopher Olson  Procedure(s) Performed: LAPAROSCOPIC COLOSTOMY REVERSAL WITH COLORECTAL ANASTOMOSIS (N/A )     Patient location during evaluation: PACU Anesthesia Type: General Level of consciousness: awake and alert Pain management: pain level controlled Vital Signs Assessment: post-procedure vital signs reviewed and stable Respiratory status: spontaneous breathing, nonlabored ventilation, respiratory function stable and patient connected to nasal cannula oxygen Cardiovascular status: blood pressure returned to baseline and stable Postop Assessment: no apparent nausea or vomiting Anesthetic complications: no    Last Vitals:  Vitals:   07/26/19 1615 07/26/19 1640  BP: 117/65 116/74  Pulse: 68 68  Resp: 13 14  Temp: 36.7 C 36.5 C  SpO2: 98% 99%    Last Pain:  Vitals:   07/26/19 1640  TempSrc: Oral  PainSc:                  Tylia Ewell A.

## 2019-07-27 ENCOUNTER — Other Ambulatory Visit: Payer: Self-pay

## 2019-07-27 ENCOUNTER — Encounter (HOSPITAL_COMMUNITY): Payer: Self-pay | Admitting: General Surgery

## 2019-07-27 LAB — BASIC METABOLIC PANEL
Anion gap: 9 (ref 5–15)
BUN: 13 mg/dL (ref 6–20)
CO2: 22 mmol/L (ref 22–32)
Calcium: 8.2 mg/dL — ABNORMAL LOW (ref 8.9–10.3)
Chloride: 105 mmol/L (ref 98–111)
Creatinine, Ser: 0.82 mg/dL (ref 0.61–1.24)
GFR calc Af Amer: >60 mL/min (ref >60)
GFR calc non Af Amer: >60 mL/min (ref >60)
Glucose, Bld: 156 mg/dL — ABNORMAL HIGH (ref 70–99)
Potassium: 4.5 mmol/L (ref 3.5–5.1)
Sodium: 136 mmol/L (ref 135–145)

## 2019-07-27 LAB — CBC
HCT: 37.6 % — ABNORMAL LOW (ref 39.0–52.0)
Hemoglobin: 12.6 g/dL — ABNORMAL LOW (ref 13.0–17.0)
MCH: 30.1 pg (ref 26.0–34.0)
MCHC: 33.5 g/dL (ref 30.0–36.0)
MCV: 89.7 fL (ref 80.0–100.0)
Platelets: 247 10*3/uL (ref 150–400)
RBC: 4.19 MIL/uL — ABNORMAL LOW (ref 4.22–5.81)
RDW: 13 % (ref 11.5–15.5)
WBC: 17.4 10*3/uL — ABNORMAL HIGH (ref 4.0–10.5)
nRBC: 0 % (ref 0.0–0.2)

## 2019-07-27 NOTE — Progress Notes (Signed)
  Progress Note: General Surgery Service   Assessment/Plan: Active Problems:   Colostomy care [facility])  s/p Procedure(s): LAPAROSCOPIC COLOSTOMY REVERSAL WITH COLORECTAL ANASTOMOSIS 07/26/2019 -advance diet -foley out this morning -dc IVF -await return of bowel function -wound packing and education    LOS: 1 day  Chief Complaint/Subjective: Pain very well controlled, tolerating liquids, moderate appetite, urinating  Objective: Vital signs in last 24 hours: Temp:  [97.5 F (36.4 C)-98.8 F (37.1 C)] 98.5 F (36.9 C) (08/01 0507) Pulse Rate:  [59-87] 69 (08/01 0507) Resp:  [13-18] 17 (08/01 0507) BP: (108-145)/(65-84) 111/65 (08/01 0507) SpO2:  [92 %-100 %] 96 % (08/01 0507) Weight:  [92.7 kg-93.5 kg] 92.7 kg (08/01 0507) Last BM Date: 07/26/19  Intake/Output from previous day: 07/31 0701 - 08/01 0700 In: 2764.2 [P.O.:360; I.V.:2404.2] Out: 2325 [Urine:2275; Blood:50] Intake/Output this shift: No intake/output data recorded.  Lungs: nonlabored  Cardiovascular: bradycardic  Abd: soft, incisions c/d/i, open wound with moderate amount drainage  Extremities: no edema  Neuro: AOx4  Lab Results: CBC  Recent Labs    07/25/19 0836 07/27/19 0435  WBC 9.7 17.4*  HGB 15.1 12.6*  HCT 46.1 37.6*  PLT 255 247   BMET Recent Labs    07/25/19 0836 07/27/19 0435  NA 139 136  K 4.2 4.5  CL 107 105  CO2 24 22  GLUCOSE 109* 156*  BUN 13 13  CREATININE 0.76 0.82  CALCIUM 9.0 8.2*   PT/INR No results for input(s): LABPROT, INR in the last 72 hours. ABG No results for input(s): PHART, HCO3 in the last 72 hours.  Invalid input(s): PCO2, PO2  Studies/Results:  Anti-infectives: Anti-infectives (From admission, onward)   Start     Dose/Rate Route Frequency Ordered Stop   07/26/19 1015  cefoTEtan (CEFOTAN) 2 g in sodium chloride 0.9 % 100 mL IVPB     2 g 200 mL/hr over 30 Minutes Intravenous On call to O.R. 07/26/19 1012 07/26/19 1225      Medications:  Scheduled Meds: . alvimopan  12 mg Oral BID  . amLODipine  5 mg Oral Daily  . celecoxib  200 mg Oral BID  . enoxaparin (LOVENOX) injection  40 mg Subcutaneous Q24H  . feeding supplement  237 mL Oral BID BM  . gabapentin  300 mg Oral BID   Continuous Infusions: PRN Meds:.LORazepam, metoprolol tartrate, morphine injection, ondansetron **OR** ondansetron (ZOFRAN) IV, oxyCODONE  Mickeal Skinner, MD [facility]

## 2019-07-28 MED ORDER — DOCUSATE SODIUM 100 MG PO CAPS
100.0000 mg | ORAL_CAPSULE | Freq: Two times a day (BID) | ORAL | Status: DC
  Administered 2019-07-28: 100 mg via ORAL
  Filled 2019-07-28 (×3): qty 1

## 2019-07-28 NOTE — Progress Notes (Signed)
  Progress Note: General Surgery Service   Assessment/Plan: Active Problems:   Colostomy care [facility])  s/p Procedure(s): LAPAROSCOPIC COLOSTOMY REVERSAL WITH COLORECTAL ANASTOMOSIS 07/26/2019 -await return of bowel function -ambulate -pain control    LOS: 2 days  Chief Complaint/Subjective: +flatus, tolerating diet, minimal pain  Objective: Vital signs in last 24 hours: Temp:  [98.1 F (36.7 C)-99.1 F (37.3 C)] 98.1 F (36.7 C) (08/02 0537) Pulse Rate:  [56-87] 56 (08/02 0537) Resp:  [16-20] 18 (08/02 0537) BP: (104-117)/(63-70) 117/70 (08/02 0537) SpO2:  [97 %-99 %] 99 % (08/02 0537) Weight:  [97.5 kg] 97.5 kg (08/02 0537) Last BM Date: 07/26/19  Intake/Output from previous day: 08/01 0701 - 08/02 0700 In: 480 [P.O.:480] Out: 1050 [Urine:1050] Intake/Output this shift: No intake/output data recorded.  Lungs: nonlabored  Cardiovascular: RRR  Abd: soft, ATTP, incisions c/d/i, left open wound packed with no erythema or drainage  Extremities: no edema  Neuro: AOx4  Lab Results: CBC  Recent Labs    07/25/19 0836 07/27/19 0435  WBC 9.7 17.4*  HGB 15.1 12.6*  HCT 46.1 37.6*  PLT 255 247   BMET Recent Labs    07/25/19 0836 07/27/19 0435  NA 139 136  K 4.2 4.5  CL 107 105  CO2 24 22  GLUCOSE 109* 156*  BUN 13 13  CREATININE 0.76 0.82  CALCIUM 9.0 8.2*   PT/INR No results for input(s): LABPROT, INR in the last 72 hours. ABG No results for input(s): PHART, HCO3 in the last 72 hours.  Invalid input(s): PCO2, PO2  Studies/Results:  Anti-infectives: Anti-infectives (From admission, onward)   Start     Dose/Rate Route Frequency Ordered Stop   07/26/19 1015  cefoTEtan (CEFOTAN) 2 g in sodium chloride 0.9 % 100 mL IVPB     2 g 200 mL/hr over 30 Minutes Intravenous On call to O.R. 07/26/19 1012 07/26/19 1225      Medications: Scheduled Meds: . alvimopan  12 mg Oral BID  . amLODipine  5 mg Oral Daily  . celecoxib  200 mg Oral BID  .  enoxaparin (LOVENOX) injection  40 mg Subcutaneous Q24H  . feeding supplement  237 mL Oral BID BM  . gabapentin  300 mg Oral BID   Continuous Infusions: PRN Meds:.LORazepam, metoprolol tartrate, morphine injection, ondansetron **OR** ondansetron (ZOFRAN) IV, oxyCODONE  Mickeal Skinner, MD [facility]

## 2019-07-29 MED ORDER — IBUPROFEN 800 MG PO TABS: 800.0000 mg | ORAL_TABLET | Freq: Three times a day (TID) | ORAL | 0 refills | Status: DC | PRN

## 2019-07-29 MED ORDER — OXYCODONE HCL 5 MG PO TABS: 5.0000 mg | ORAL_TABLET | Freq: Four times a day (QID) | ORAL | 0 refills | Status: DC | PRN

## 2019-07-29 NOTE — Progress Notes (Signed)
Discharge and medication instructions reviewed with patient. Questions answered and patient denies further questions. No prescriptions given. Spouse is en route to drive patient home. Teran Knittle, RN 

## 2019-07-29 NOTE — Discharge Summary (Signed)
Physician Discharge Summary  Patient ID: Christopher Olson MRN: [id number] DOB/AGE: 04-23-1966 53 y.o.  Admit date: 07/26/2019 Discharge date: 07/29/2019  Admission Diagnoses:  Discharge Diagnoses:  Active Problems:   Colostomy care [facility])   Discharged Condition: good  Hospital Course: 53 yo male presented for colostomy reversal. He did well and was quickly advanced on diet. He had return of bowel function POD 2. He was discharged home POD 3.  Consults: None  Significant Diagnostic Studies:  CBC    Component Value Date/Time   WBC 17.4 (H) 07/27/2019 0435   RBC 4.19 (L) 07/27/2019 0435   HGB 12.6 (L) 07/27/2019 0435   HCT 37.6 (L) 07/27/2019 0435   PLT 247 07/27/2019 0435   MCV 89.7 07/27/2019 0435   MCH 30.1 07/27/2019 0435   MCHC 33.5 07/27/2019 0435   RDW 13.0 07/27/2019 0435   LYMPHSABS 1.6 03/04/2019 0542   MONOABS 0.6 03/04/2019 0542   EOSABS 0.0 03/04/2019 0542   BASOSABS 0.0 03/04/2019 0542    Treatments: surgery: lap colostomy reversal  Discharge Exam: Blood pressure 112/77, pulse (!) 56, temperature 98.1 F (36.7 C), temperature source Oral, resp. rate 16, height 5\' 6"  (1.676 m), weight 92.3 kg, SpO2 97 %. General appearance: alert and cooperative Head: Normocephalic, without obvious abnormality, atraumatic Neck: no adenopathy, no carotid bruit, no JVD, supple, symmetrical, trachea midline and thyroid not enlarged, symmetric, no tenderness/mass/nodules GI: incisions c/d/i, open wound with no erythema  Disposition: Discharge disposition: 01-Home or Self Care       Discharge Instructions    Call MD for:  difficulty breathing, headache or visual disturbances   Complete by: As directed    Call MD for:  hives   Complete by: As directed    Call MD for:  persistant nausea and vomiting   Complete by: As directed    Call MD for:  redness, tenderness, or signs of infection (pain, swelling, redness, odor or green/yellow discharge around incision site)   Complete  by: As directed    Call MD for:  severe uncontrolled pain   Complete by: As directed    Call MD for:  temperature >100.4   Complete by: As directed    Diet - low sodium heart healthy   Complete by: As directed    Discharge wound care:   Complete by: As directed    Ok to shower. Pack left side wound with saline moistened gauze 2 times a day.   Driving Restrictions   Complete by: As directed    No driving while on narcotics   Increase activity slowly   Complete by: As directed    Lifting restrictions   Complete by: As directed    No lifting greater than 20 pounds for 3 weeks     Allergies as of 07/29/2019      Reactions   Shellfish-derived Products Anaphylaxis   Lactose Intolerance (gi) Diarrhea, Other (See Comments)   Bloating, also      Medication List    STOP taking these medications   benzonatate 200 MG capsule Commonly known as: TESSALON   methocarbamol 750 MG tablet Commonly known as: ROBAXIN     TAKE these medications   acetaminophen 500 MG tablet Commonly known as: TYLENOL Take 2 tablets (1,000 mg total) by mouth every 6 (six) hours. What changed:   when to take this  reasons to take this   albuterol 108 (90 Base) MCG/ACT inhaler Commonly known as: VENTOLIN HFA Inhale 2 puffs into the lungs every 6 (  six) hours as needed for wheezing or shortness of breath. What changed: Another medication with the same name was removed. Continue taking this medication, and follow the directions you see here.   amLODipine 5 MG tablet Commonly known as: NORVASC Take 1 tablet (5 mg total) by mouth daily.   ibuprofen 800 MG tablet Commonly known as: ADVIL Take 1 tablet (800 mg total) by mouth every 8 (eight) hours as needed. What changed:   medication strength  how much to take  when to take this  reasons to take this   LORazepam 0.5 MG tablet Commonly known as: ATIVAN Take 0.5 mg by mouth at bedtime as needed for anxiety or sleep.   oxyCODONE 5 MG immediate  release tablet Commonly known as: Oxy IR/ROXICODONE Take 1 tablet (5 mg total) by mouth every 6 (six) hours as needed for severe pain. What changed:   how much to take  when to take this  reasons to take this   PROBIOTIC PO Take 1 capsule by mouth daily.            Discharge Care Instructions  (From admission, onward)         Start     Ordered   07/29/19 0000  Discharge wound care:    Comments: Ok to shower. Pack left side wound with saline moistened gauze 2 times a day.   07/29/19 0759           Signed: De BlanchLuke Aaron Kinsinger 07/29/2019, 8:00 AM

## 2020-08-18 IMAGING — CT CT ANGIO CHEST
2 of 7 series · 18 of 46 positions shown · IV contrast (OMNIPAQUE)
Comparison: Chest x-ray February 28, 2019

CLINICAL DATA: Chest pain.

EXAM:
CT ANGIOGRAPHY CHEST WITH CONTRAST
TECHNIQUE: Multidetector CT imaging of the chest was performed using the
standard protocol during bolus administration of intravenous
contrast. Multiplanar CT image reconstructions and MIPs were
obtained to evaluate the vascular anatomy.
CONTRAST:  75mL OMNIPAQUE IOHEXOL 350 MG/ML SOLN

[Series 10: coronal mpr · coronal · 0.56mm/px · 3 of 139 slices shown]
[im 35/139  soft-tissue]
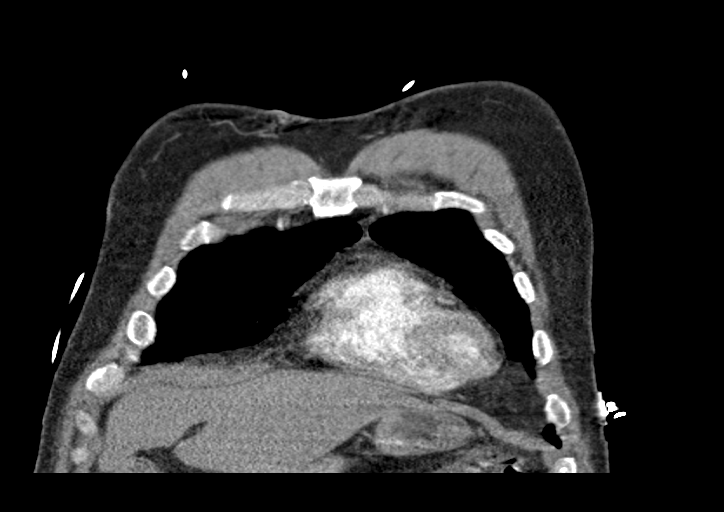
[im 70/139  soft-tissue]
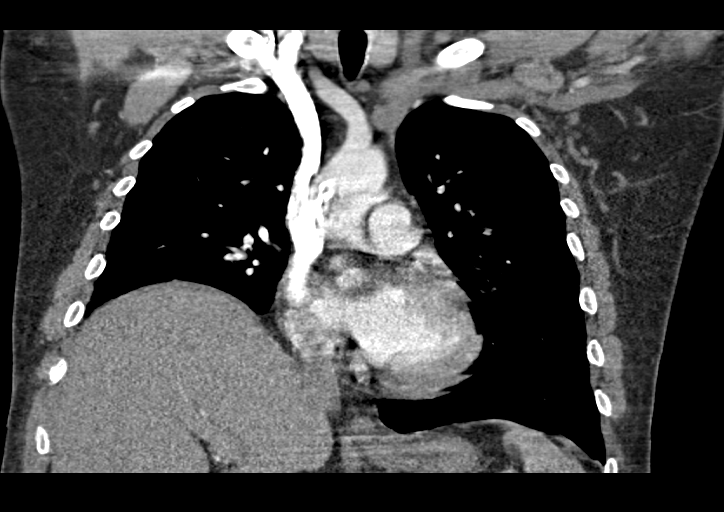
[im 104/139  soft-tissue]
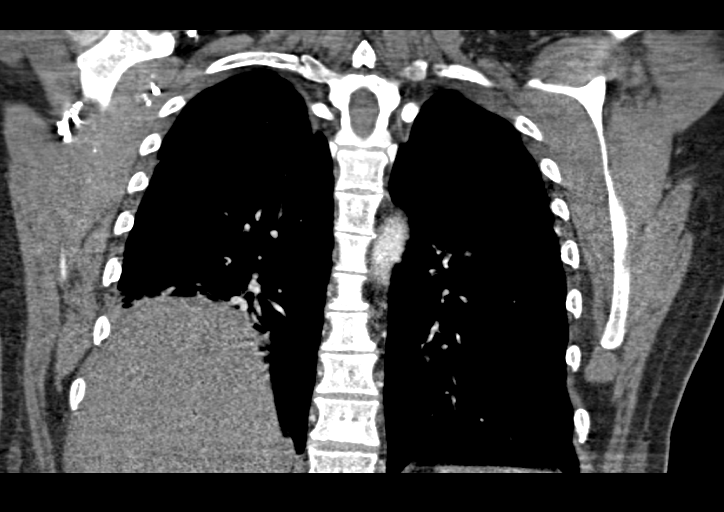

[Series 12: thins · axial · 0.77mm/px · z∈[+38,+264]mm · 15 of 250 slices shown]
[im 12/250  lung]
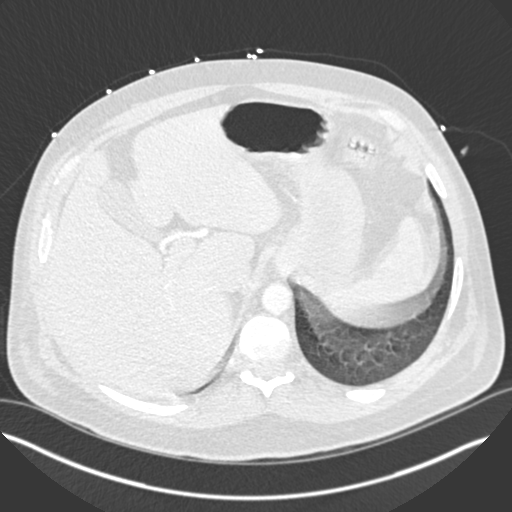
[im 36/250  soft-tissue]
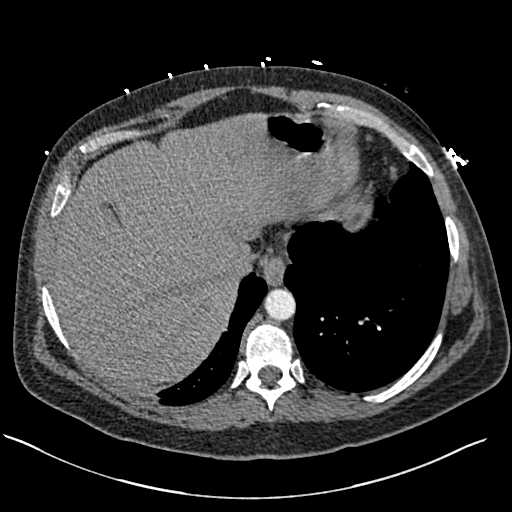
[im 48/250  lung]
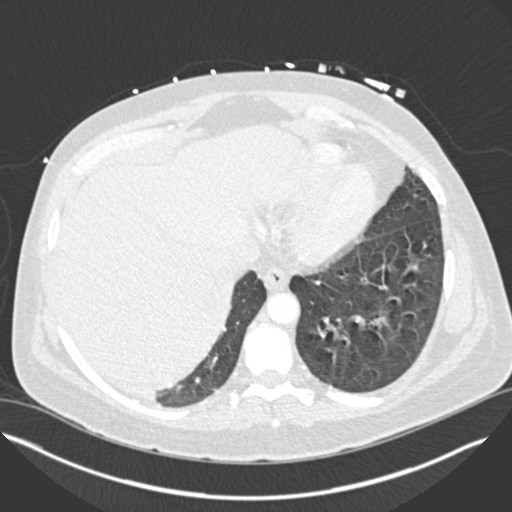
[im 60/250  soft-tissue]
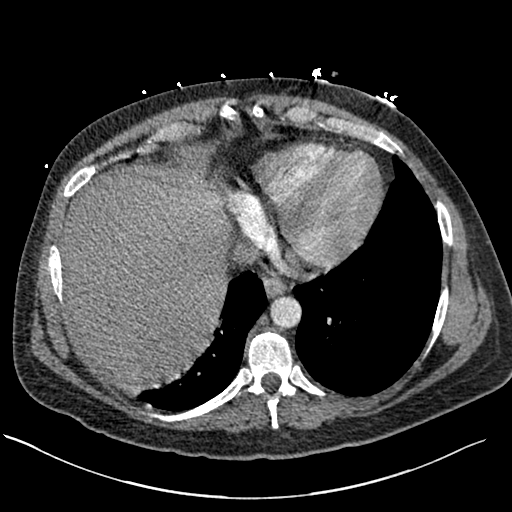
[im 84/250  lung]
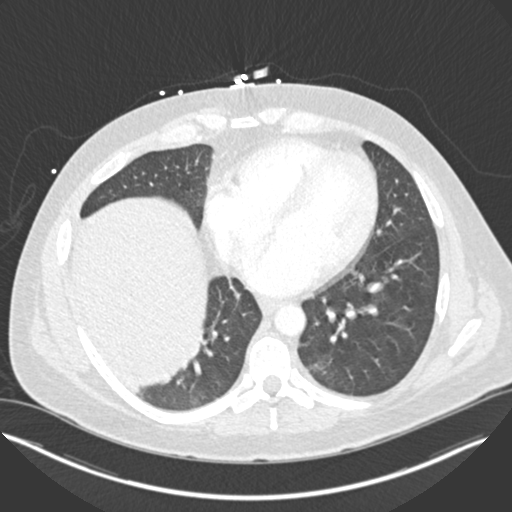
[im 95/250  soft-tissue]
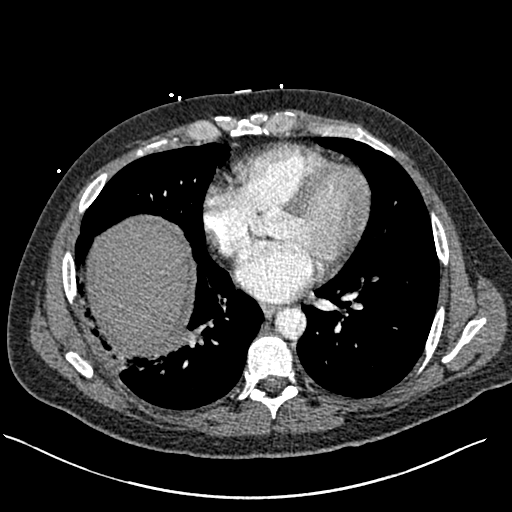
[im 107/250  lung]
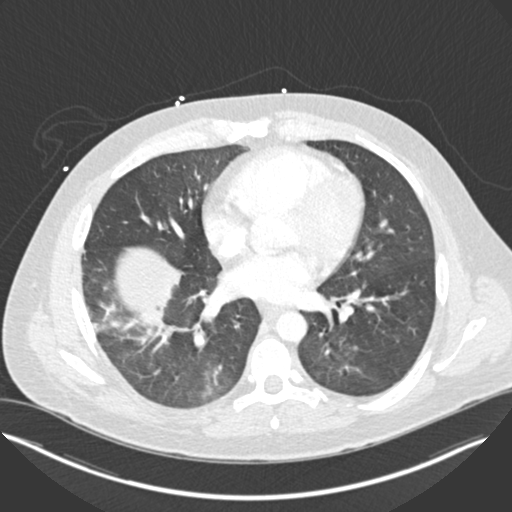
[im 131/250  soft-tissue]
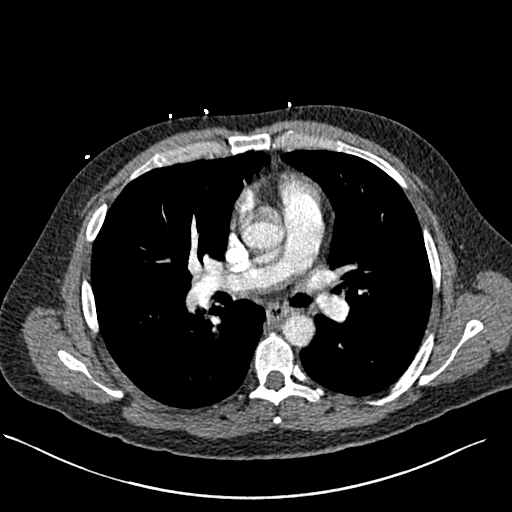
[im 143/250  lung]
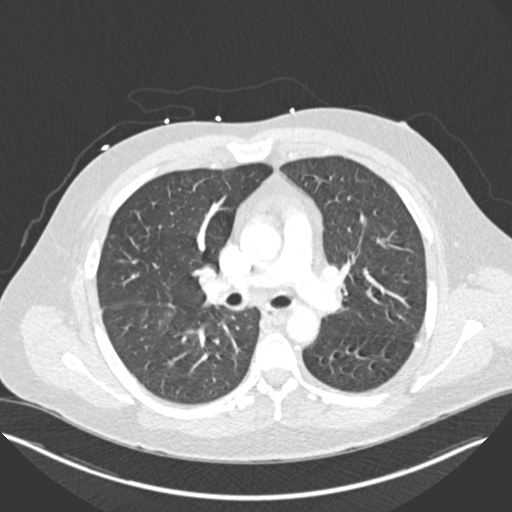
[im 155/250  soft-tissue]
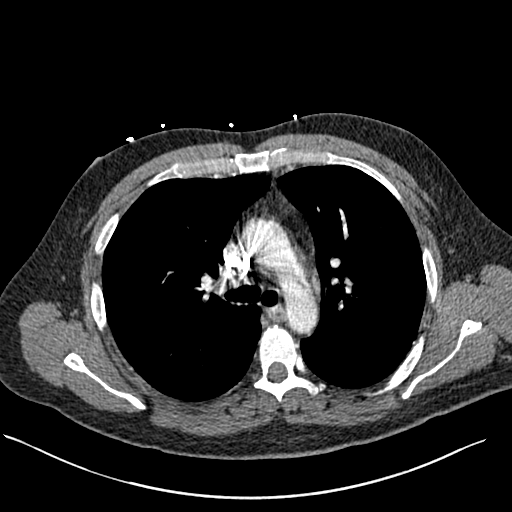
[im 178/250  lung]
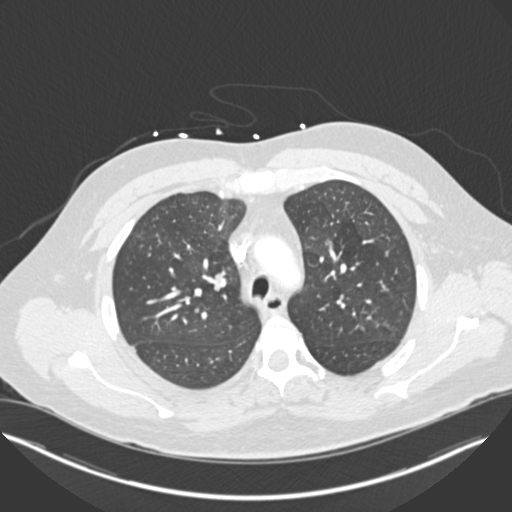
[im 190/250  soft-tissue]
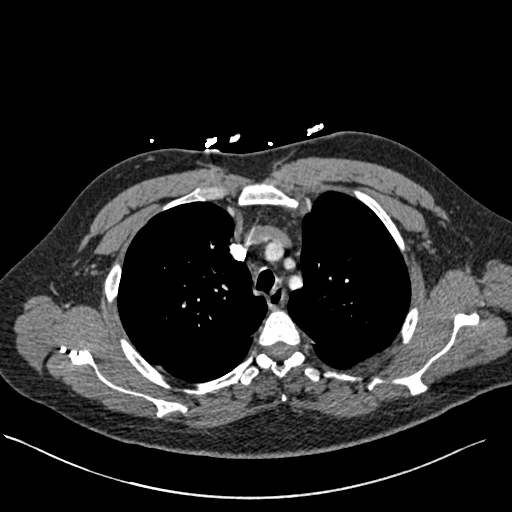
[im 202/250  lung]
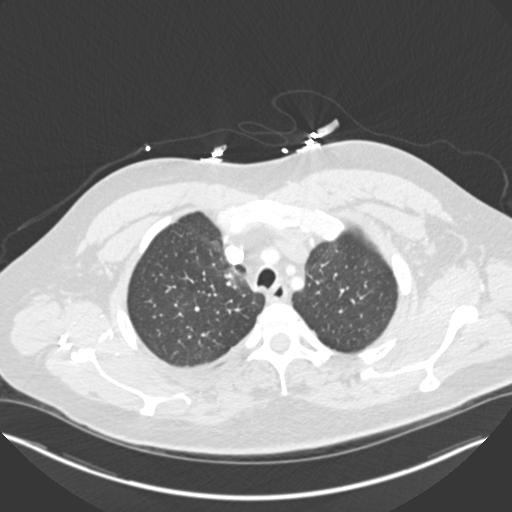
[im 226/250  soft-tissue]
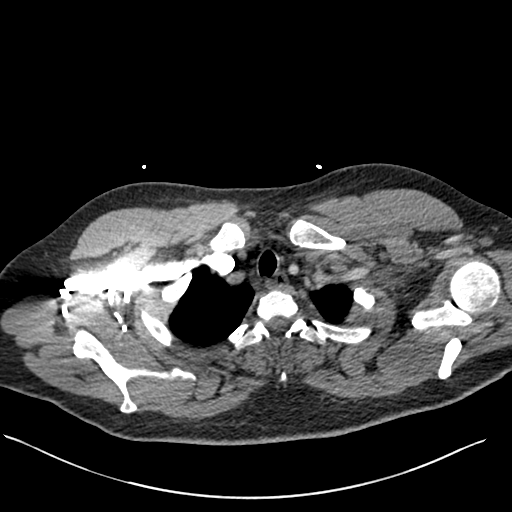
[im 238/250  lung]
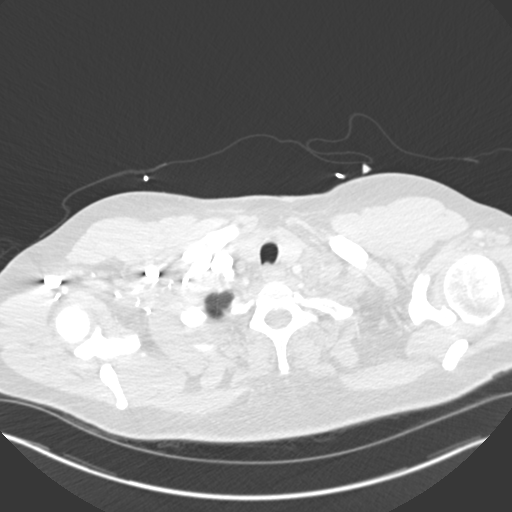

[18 of 46 positions shown; findings below may reference images not displayed]

FINDINGS: Cardiovascular: No definite focal filling defect is identified in
the main pulmonary and segmental pulmonary arteries of bilateral
upper lobes. Evaluation of subsegmental and segmental pulmonary
arteries of the bilateral mid to lower lungs are limited due to
respiratory motion. The heart size is normal. There is no
pericardial effusion.

Mediastinum/Nodes: No enlarged mediastinal, hilar, or axillary lymph
nodes. Thyroid gland, trachea, and esophagus demonstrate no
significant findings.

Lungs/Pleura: Scarring of right apex is noted. Patchy opacity of the
right lower lobe and to a lesser degree left lower lobe are
identified. There is no pleural effusion.

Upper Abdomen: No acute abnormality.

Musculoskeletal: No acute abnormality.

Review of the MIP images confirms the above findings.
IMPRESSION: No definite filling defect identified to suggest pulmonary embolus
in the main pulmonary arteries. Valuation of the segmental
subsegmental pulmonary arteries of the mid to lower lungs are
limited due to respiratory motion.

Patchy opacity of the right lower lobe and to a lesser degree left
lower lobe, developing pneumonias are not excluded.

## 2020-09-05 ENCOUNTER — Ambulatory Visit: Payer: BC Managed Care – PPO | Attending: Internal Medicine

## 2020-09-05 ENCOUNTER — Other Ambulatory Visit: Payer: Self-pay

## 2020-09-05 DIAGNOSIS — Z23 Encounter for immunization: Secondary | ICD-10-CM

## 2020-09-05 NOTE — Progress Notes (Signed)
   Covid-19 Vaccination Clinic  Name:  Akhil Piscopo    MRN: [id number] DOB: [birth year]  09/05/2020  Mr. Capili was observed post Covid-19 immunization for 15 minutes without incident. He was provided with Vaccine Information Sheet and instruction to access the V-Safe system.   Mr. Creason was instructed to call 911 with any severe reactions post vaccine: Marland Kitchen Difficulty breathing  . Swelling of face and throat  . A fast heartbeat  . A bad rash all over body  . Dizziness and weakness

## 2021-07-07 ENCOUNTER — Ambulatory Visit: Payer: Self-pay | Admitting: General Surgery

## 2021-07-20 NOTE — Patient Instructions (Signed)
DUE TO COVID-19 ONLY ONE VISITOR IS ALLOWED TO COME WITH YOU AND STAY IN THE WAITING ROOM ONLY DURING PRE OP AND PROCEDURE DAY OF SURGERY. THE 1 VISITOR  MAY VISIT WITH YOU AFTER SURGERY IN YOUR PRIVATE ROOM DURING VISITING HOURS ONLY!                 Christopher Olson     Your procedure is scheduled on: 08/05/21   Report to [facility] Main  Entrance   Report to short stay at 5:15 AM     Call this number if you have problems the morning of surgery [phone]     BRUSH YOUR TEETH MORNING OF SURGERY AND RINSE YOUR MOUTH OUT, NO CHEWING GUM CANDY OR MINTS.   No food after midnight.    You may have clear liquid until 4:30 AM.    At 4:00 AM drink pre surgery drink.   Nothing by mouth after 4:30 AM.    Take these medicines the morning of surgery with A SIP OF WATER: Amlodipine use your inhaler and bring it with you                                 You may not have any metal on your body including              piercings  Do not wear jewelry, lotions, powders or deodorant             Men may shave face and neck.   Do not bring valuables to the hospital. [facility] IS NOT             RESPONSIBLE   FOR VALUABLES.  Contacts, dentures or bridgework may not be worn into surgery.       Patients discharged the day of surgery will not be allowed to drive home.  IF YOU ARE HAVING SURGERY AND GOING HOME THE SAME DAY, YOU MUST HAVE AN ADULT TO DRIVE YOU HOME AND BE WITH YOU FOR 24 HOURS.  YOU MAY GO HOME BY TAXI OR UBER OR ORTHERWISE, BUT AN ADULT MUST ACCOMPANY YOU HOME AND STAY WITH YOU FOR 24 HOURS.  Name and phone number of your driver:  Special Instructions: N/A              Please read over the following fact sheets you were given: _____________________________________________________________________             [facility] - Preparing for Surgery Before surgery, you can play an important role.  Because skin is not sterile, your skin needs to be as free of germs as  possible.  You can reduce the number of germs on your skin by washing with CHG (chlorahexidine gluconate) soap before surgery.  CHG is an antiseptic cleaner which kills germs and bonds with the skin to continue killing germs even after washing. Please DO NOT use if you have an allergy to CHG or antibacterial soaps.  If your skin becomes reddened/irritated stop using the CHG and inform your nurse when you arrive at Short Stay.  You may shave your face/neck.  Please follow these instructions carefully:  1.  Shower with CHG Soap the night before surgery and the  morning of Surgery.  2.  If you choose to wash your hair, wash your hair first as usual with your  normal  shampoo.  3.  After you shampoo, rinse your hair and  body thoroughly to remove the  shampoo.                                        4.  Use CHG as you would any other liquid soap.  You can apply chg directly  to the skin and wash                       Gently with a scrungie or clean washcloth.  5.  Apply the CHG Soap to your body ONLY FROM THE NECK DOWN.   Do not use on face/ open                           Wound or open sores. Avoid contact with eyes, ears mouth and genitals (private parts).                       Wash face,  Genitals (private parts) with your normal soap.             6.  Wash thoroughly, paying special attention to the area where your surgery  will be performed.  7.  Thoroughly rinse your body with warm water from the neck down.  8.  DO NOT shower/wash with your normal soap after using and rinsing off  the CHG Soap.             9.  Pat yourself dry with a clean towel.            10.  Wear clean pajamas.            11.  Place clean sheets on your bed the night of your first shower and do not  sleep with pets. Day of Surgery : Do not apply any lotions/deodorants the morning of surgery.  Please wear clean clothes to the hospital/surgery center.  FAILURE TO FOLLOW THESE INSTRUCTIONS MAY RESULT IN THE CANCELLATION OF YOUR  SURGERY PATIENT SIGNATURE_________________________________  NURSE SIGNATURE__________________________________  ________________________________________________________________________

## 2021-07-21 ENCOUNTER — Encounter (HOSPITAL_COMMUNITY): Payer: Self-pay

## 2021-07-21 ENCOUNTER — Other Ambulatory Visit: Payer: Self-pay

## 2021-07-21 ENCOUNTER — Encounter (HOSPITAL_COMMUNITY)
Admission: RE | Admit: 2021-07-21 | Discharge: 2021-07-21 | Disposition: A | Discharge status: Home / Self Care | Payer: BC Managed Care – PPO | Source: Ambulatory Visit | Attending: General Surgery | Admitting: General Surgery

## 2021-07-21 DIAGNOSIS — Z01818 Encounter for other preprocedural examination: Secondary | ICD-10-CM | POA: Diagnosis not present

## 2021-07-21 HISTORY — DX: Unspecified asthma, uncomplicated: J45.909

## 2021-07-21 LAB — CBC
HCT: 45.7 % (ref 39.0–52.0)
Hemoglobin: 15.4 g/dL (ref 13.0–17.0)
MCH: 30.3 pg (ref 26.0–34.0)
MCHC: 33.7 g/dL (ref 30.0–36.0)
MCV: 90 fL (ref 80.0–100.0)
Platelets: 245 10*3/uL (ref 150–400)
RBC: 5.08 MIL/uL (ref 4.22–5.81)
RDW: 12.8 % (ref 11.5–15.5)
WBC: 8.3 10*3/uL (ref 4.0–10.5)
nRBC: 0 % (ref 0.0–0.2)

## 2021-07-21 LAB — BASIC METABOLIC PANEL
Anion gap: 6 (ref 5–15)
BUN: 21 mg/dL — ABNORMAL HIGH (ref 6–20)
CO2: 27 mmol/L (ref 22–32)
Calcium: 9.2 mg/dL (ref 8.9–10.3)
Chloride: 108 mmol/L (ref 98–111)
Creatinine, Ser: 0.82 mg/dL (ref 0.61–1.24)
GFR, Estimated: >60 mL/min (ref >60)
Glucose, Bld: 111 mg/dL — ABNORMAL HIGH (ref 70–99)
Potassium: 4.7 mmol/L (ref 3.5–5.1)
Sodium: 141 mmol/L (ref 135–145)

## 2021-07-21 NOTE — Progress Notes (Signed)
COVID Vaccine Completed:Yes Date COVID Vaccine completed:03/14/20 Booster 09/05/20,04/06/21 COVID vaccine manufacturer: Pfizer     PCP - D. Lillette Boxer at [place] [facility]LOV June 2022 for anual Cardiologist - none  Chest x-ray - no EKG - 07/21/21 chart Stress Test - no ECHO - no Cardiac Cath - NA Pacemaker/ICD device last checked:NA  Sleep Study - no CPAP -   Fasting Blood Sugar - NA Checks Blood Sugar _____ times a day  Blood Thinner Instructions:NA Aspirin Instructions: Last Dose:  Anesthesia review: no  Patient denies shortness of breath, fever, cough and chest pain at PAT appointment Pt has lost weight. He has no SOB climbing stairs, doing housework or with ADls. He hasn't needed an inhaler in a long time. His asthma was mostly as a child but not much of a problem now.  Patient verbalized understanding of instructions that were given to them at the PAT appointment. Patient was also instructed that they will need to review over the PAT instructions again at home before surgery. yes

## 2021-08-04 NOTE — Anesthesia Preprocedure Evaluation (Addendum)
Anesthesia Evaluation  Patient identified by MRN, date of birth, ID band Patient awake    Reviewed: Allergy & Precautions, NPO status , Patient's Chart, lab work & pertinent test results  Airway Mallampati: II  TM Distance: >3 FB Neck ROM: Full    Dental no notable dental hx. (+) Teeth Intact, Dental Advisory Given   Pulmonary neg pulmonary ROS, asthma ,    Pulmonary exam normal breath sounds clear to auscultation       Cardiovascular hypertension, Pt. on medications negative cardio ROS Normal cardiovascular exam Rhythm:Regular Rate:Normal     Neuro/Psych Anxiety Meniere's disease with hearing loss negative neurological ROS  negative psych ROS   GI/Hepatic negative GI ROS, Neg liver ROS, Diverticulitis S/P partial colectomy with diverting colostomy   Endo/Other  negative endocrine ROSObesity  Renal/GU negative Renal ROS  negative genitourinary   Musculoskeletal negative musculoskeletal ROS (+)   Abdominal (+) - obese,   Peds negative pediatric ROS (+)  Hematology negative hematology ROS (+)   Anesthesia Other Findings   Reproductive/Obstetrics negative OB ROS                            Anesthesia Physical  Anesthesia Plan  ASA: 2  Anesthesia Plan: General   Post-op Pain Management:    Induction: Intravenous  PONV Risk Score and Plan: 4 or greater and Scopolamine patch - Pre-op, Midazolam, Ondansetron, Dexamethasone and Treatment may vary due to age or medical condition  Airway Management Planned: Oral ETT and LMA  Additional Equipment: None  Intra-op Plan:   Post-operative Plan: Extubation in OR  Informed Consent: I have reviewed the patients History and Physical, chart, labs and discussed the procedure including the risks, benefits and alternatives for the proposed anesthesia with the patient or authorized representative who has indicated his/her understanding and  acceptance.     Dental advisory given  Plan Discussed with: CRNA and Anesthesiologist  Anesthesia Plan Comments:         Anesthesia Quick Evaluation

## 2021-08-05 ENCOUNTER — Ambulatory Visit (HOSPITAL_COMMUNITY): Payer: BC Managed Care – PPO | Admitting: Certified Registered Nurse Anesthetist

## 2021-08-05 ENCOUNTER — Encounter (HOSPITAL_COMMUNITY)
Admission: RE | Disposition: A | Discharge status: Home / Self Care | Payer: Self-pay | Source: Ambulatory Visit | Attending: General Surgery

## 2021-08-05 ENCOUNTER — Ambulatory Visit (HOSPITAL_COMMUNITY)
Admission: RE | Admit: 2021-08-05 | Discharge: 2021-08-05 | Disposition: A | Discharge status: Home / Self Care | Payer: BC Managed Care – PPO | Source: Ambulatory Visit | Attending: General Surgery | Admitting: General Surgery

## 2021-08-05 ENCOUNTER — Encounter (HOSPITAL_COMMUNITY): Payer: Self-pay | Admitting: General Surgery

## 2021-08-05 DIAGNOSIS — Z8719 Personal history of other diseases of the digestive system: Secondary | ICD-10-CM | POA: Insufficient documentation

## 2021-08-05 DIAGNOSIS — H9191 Unspecified hearing loss, right ear: Secondary | ICD-10-CM | POA: Insufficient documentation

## 2021-08-05 DIAGNOSIS — I10 Essential (primary) hypertension: Secondary | ICD-10-CM | POA: Diagnosis not present

## 2021-08-05 DIAGNOSIS — Z9049 Acquired absence of other specified parts of digestive tract: Secondary | ICD-10-CM | POA: Insufficient documentation

## 2021-08-05 DIAGNOSIS — Z8379 Family history of other diseases of the digestive system: Secondary | ICD-10-CM | POA: Insufficient documentation

## 2021-08-05 DIAGNOSIS — K432 Incisional hernia without obstruction or gangrene: Secondary | ICD-10-CM | POA: Diagnosis not present

## 2021-08-05 HISTORY — PX: INCISIONAL HERNIA REPAIR: SHX193

## 2021-08-05 SURGERY — REPAIR, HERNIA, INCISIONAL
Anesthesia: General | Site: Abdomen

## 2021-08-05 MED ORDER — ROCURONIUM BROMIDE 10 MG/ML (PF) SYRINGE
PREFILLED_SYRINGE | INTRAVENOUS | Status: DC | PRN
  Administered 2021-08-05: 50 mg via INTRAVENOUS
  Administered 2021-08-05: 20 mg via INTRAVENOUS
  Administered 2021-08-05 (×2): 10 mg via INTRAVENOUS

## 2021-08-05 MED ORDER — IBUPROFEN 800 MG PO TABS: 800.0000 mg | ORAL_TABLET | Freq: Three times a day (TID) | ORAL | 0 refills | Status: AC | PRN

## 2021-08-05 MED ORDER — CHLORHEXIDINE GLUCONATE 0.12 % MT SOLN
15.0000 mL | Freq: Once | OROMUCOSAL | Status: AC
  Administered 2021-08-05: 15 mL via OROMUCOSAL

## 2021-08-05 MED ORDER — ONDANSETRON HCL 4 MG/2ML IJ SOLN
INTRAMUSCULAR | Status: DC | PRN
  Administered 2021-08-05: 4 mg via INTRAVENOUS

## 2021-08-05 MED ORDER — OXYCODONE HCL 5 MG PO TABS: 5.0000 mg | ORAL_TABLET | Freq: Once | ORAL | Status: DC | PRN

## 2021-08-05 MED ORDER — CHLORHEXIDINE GLUCONATE CLOTH 2 % EX PADS: 6.0000 | MEDICATED_PAD | Freq: Once | CUTANEOUS | Status: DC

## 2021-08-05 MED ORDER — MEPERIDINE HCL 50 MG/ML IJ SOLN: 6.2500 mg | INTRAMUSCULAR | Status: DC | PRN

## 2021-08-05 MED ORDER — LACTATED RINGERS IV SOLN: INTRAVENOUS | Status: DC

## 2021-08-05 MED ORDER — ORAL CARE MOUTH RINSE: 15.0000 mL | Freq: Once | OROMUCOSAL | Status: AC

## 2021-08-05 MED ORDER — ACETAMINOPHEN 160 MG/5ML PO SOLN: 325.0000 mg | ORAL | Status: DC | PRN

## 2021-08-05 MED ORDER — CELECOXIB 200 MG PO CAPS
400.0000 mg | ORAL_CAPSULE | ORAL | Status: AC
  Administered 2021-08-05: 400 mg via ORAL
  Filled 2021-08-05: qty 2

## 2021-08-05 MED ORDER — OXYCODONE HCL 5 MG PO TABS: 5.0000 mg | ORAL_TABLET | Freq: Four times a day (QID) | ORAL | 0 refills | Status: AC | PRN

## 2021-08-05 MED ORDER — ACETAMINOPHEN 500 MG PO TABS
1000.0000 mg | ORAL_TABLET | ORAL | Status: AC
  Administered 2021-08-05: 1000 mg via ORAL
  Filled 2021-08-05: qty 2

## 2021-08-05 MED ORDER — DEXAMETHASONE SODIUM PHOSPHATE 10 MG/ML IJ SOLN
INTRAMUSCULAR | Status: DC | PRN
  Administered 2021-08-05: 10 mg via INTRAVENOUS

## 2021-08-05 MED ORDER — PROPOFOL 10 MG/ML IV BOLUS
INTRAVENOUS | Status: AC
  Filled 2021-08-05: qty 20

## 2021-08-05 MED ORDER — MEPERIDINE HCL 50 MG/ML IJ SOLN
INTRAMUSCULAR | Status: AC
  Administered 2021-08-05: 12.5 mg via INTRAVENOUS
  Filled 2021-08-05: qty 1

## 2021-08-05 MED ORDER — LIDOCAINE 2% (20 MG/ML) 5 ML SYRINGE
INTRAMUSCULAR | Status: AC
  Filled 2021-08-05: qty 5

## 2021-08-05 MED ORDER — PROPOFOL 10 MG/ML IV BOLUS
INTRAVENOUS | Status: DC | PRN
  Administered 2021-08-05: 150 mg via INTRAVENOUS

## 2021-08-05 MED ORDER — BUPIVACAINE-EPINEPHRINE 0.25% -1:200000 IJ SOLN
INTRAMUSCULAR | Status: DC | PRN
  Administered 2021-08-05: 30 mL

## 2021-08-05 MED ORDER — FENTANYL CITRATE (PF) 100 MCG/2ML IJ SOLN
25.0000 ug | INTRAMUSCULAR | Status: DC | PRN
  Administered 2021-08-05: 25 ug via INTRAVENOUS

## 2021-08-05 MED ORDER — 0.9 % SODIUM CHLORIDE (POUR BTL) OPTIME
TOPICAL | Status: DC | PRN
  Administered 2021-08-05: 1000 mL

## 2021-08-05 MED ORDER — FENTANYL CITRATE (PF) 100 MCG/2ML IJ SOLN
INTRAMUSCULAR | Status: DC | PRN
  Administered 2021-08-05 (×2): 50 ug via INTRAVENOUS
  Administered 2021-08-05: 100 ug via INTRAVENOUS

## 2021-08-05 MED ORDER — MIDAZOLAM HCL 2 MG/2ML IJ SOLN
INTRAMUSCULAR | Status: AC
  Filled 2021-08-05: qty 2

## 2021-08-05 MED ORDER — FENTANYL CITRATE (PF) 100 MCG/2ML IJ SOLN
INTRAMUSCULAR | Status: AC
  Administered 2021-08-05: 50 ug via INTRAVENOUS
  Filled 2021-08-05: qty 2

## 2021-08-05 MED ORDER — EPHEDRINE SULFATE-NACL 50-0.9 MG/10ML-% IV SOSY
PREFILLED_SYRINGE | INTRAVENOUS | Status: DC | PRN
  Administered 2021-08-05 (×2): 5 mg via INTRAVENOUS

## 2021-08-05 MED ORDER — ACETAMINOPHEN 325 MG PO TABS: 325.0000 mg | ORAL_TABLET | ORAL | Status: DC | PRN

## 2021-08-05 MED ORDER — MIDAZOLAM HCL 5 MG/5ML IJ SOLN
INTRAMUSCULAR | Status: DC | PRN
  Administered 2021-08-05: 2 mg via INTRAVENOUS

## 2021-08-05 MED ORDER — LIDOCAINE 2% (20 MG/ML) 5 ML SYRINGE
INTRAMUSCULAR | Status: DC | PRN
  Administered 2021-08-05: 100 mg via INTRAVENOUS

## 2021-08-05 MED ORDER — DEXAMETHASONE SODIUM PHOSPHATE 10 MG/ML IJ SOLN
INTRAMUSCULAR | Status: AC
  Filled 2021-08-05: qty 1

## 2021-08-05 MED ORDER — CEFAZOLIN SODIUM-DEXTROSE 2-4 GM/100ML-% IV SOLN
2.0000 g | INTRAVENOUS | Status: AC
  Administered 2021-08-05: 2 g via INTRAVENOUS
  Filled 2021-08-05: qty 100

## 2021-08-05 MED ORDER — GLYCOPYRROLATE 0.2 MG/ML IJ SOLN
INTRAMUSCULAR | Status: DC | PRN
  Administered 2021-08-05 (×2): .1 mg via INTRAVENOUS

## 2021-08-05 MED ORDER — BUPIVACAINE-EPINEPHRINE (PF) 0.25% -1:200000 IJ SOLN
INTRAMUSCULAR | Status: AC
  Filled 2021-08-05: qty 30

## 2021-08-05 MED ORDER — EPHEDRINE 5 MG/ML INJ
INTRAVENOUS | Status: AC
  Filled 2021-08-05: qty 5

## 2021-08-05 MED ORDER — ONDANSETRON HCL 4 MG/2ML IJ SOLN
4.0000 mg | Freq: Once | INTRAMUSCULAR | Status: DC | PRN
Start: 2021-08-05 — End: 2021-08-05

## 2021-08-05 MED ORDER — ONDANSETRON HCL 4 MG/2ML IJ SOLN
INTRAMUSCULAR | Status: AC
  Filled 2021-08-05: qty 2

## 2021-08-05 MED ORDER — FENTANYL CITRATE (PF) 100 MCG/2ML IJ SOLN
INTRAMUSCULAR | Status: AC
  Filled 2021-08-05: qty 2

## 2021-08-05 MED ORDER — OXYCODONE HCL 5 MG/5ML PO SOLN: 5.0000 mg | Freq: Once | ORAL | Status: DC | PRN

## 2021-08-05 MED ORDER — BUPIVACAINE LIPOSOME 1.3 % IJ SUSP
20.0000 mL | Freq: Once | INTRAMUSCULAR | Status: AC
  Administered 2021-08-05: 20 mL
  Filled 2021-08-05: qty 20

## 2021-08-05 MED ORDER — ENSURE PRE-SURGERY PO LIQD
296.0000 mL | Freq: Once | ORAL | Status: DC
  Filled 2021-08-05: qty 296

## 2021-08-05 MED ORDER — ROCURONIUM BROMIDE 10 MG/ML (PF) SYRINGE
PREFILLED_SYRINGE | INTRAVENOUS | Status: AC
  Filled 2021-08-05: qty 10

## 2021-08-05 MED ORDER — SUGAMMADEX SODIUM 200 MG/2ML IV SOLN
INTRAVENOUS | Status: DC | PRN
  Administered 2021-08-05: 200 mg via INTRAVENOUS

## 2021-08-05 SURGICAL SUPPLY — 41 items
BAG COUNTER SPONGE SURGICOUNT (BAG) IMPLANT
BENZOIN TINCTURE PRP APPL 2/3 (GAUZE/BANDAGES/DRESSINGS) ×2 IMPLANT
BLADE SURG 15 STRL LF DISP TIS (BLADE) ×1 IMPLANT
BLADE SURG 15 STRL SS (BLADE) ×1
CHLORAPREP W/TINT 26 (MISCELLANEOUS) ×2 IMPLANT
COVER SURGICAL LIGHT HANDLE (MISCELLANEOUS) ×2 IMPLANT
DECANTER SPIKE VIAL GLASS SM (MISCELLANEOUS) ×2 IMPLANT
DERMABOND ADVANCED (GAUZE/BANDAGES/DRESSINGS) ×1
DERMABOND ADVANCED .7 DNX12 (GAUZE/BANDAGES/DRESSINGS) ×1 IMPLANT
DRAIN CHANNEL 19F RND (DRAIN) IMPLANT
DRAPE LAPAROSCOPIC ABDOMINAL (DRAPES) ×2 IMPLANT
DRSG TELFA 4X10 ISLAND STR (GAUZE/BANDAGES/DRESSINGS) IMPLANT
DRSG TELFA 4X8 ISLAND (GAUZE/BANDAGES/DRESSINGS) IMPLANT
ELECT REM PT RETURN 15FT ADLT (MISCELLANEOUS) ×2 IMPLANT
EVACUATOR SILICONE 100CC (DRAIN) IMPLANT
GLOVE SURG POLYISO LF SZ7 (GLOVE) ×2 IMPLANT
GLOVE SURG UNDER POLY LF SZ7 (GLOVE) ×2 IMPLANT
GOWN STRL REUS W/TWL LRG LVL3 (GOWN DISPOSABLE) ×2 IMPLANT
GOWN STRL REUS W/TWL XL LVL3 (GOWN DISPOSABLE) ×2 IMPLANT
KIT BASIN OR (CUSTOM PROCEDURE TRAY) ×2 IMPLANT
KIT TURNOVER KIT A (KITS) ×2 IMPLANT
MESH HERNIA 6X6 BARD (Mesh General) ×1 IMPLANT
MESH HERNIA BARD 6X6 (Mesh General) ×1 IMPLANT
NEEDLE HYPO 22GX1.5 SAFETY (NEEDLE) ×2 IMPLANT
PACK GENERAL/GYN (CUSTOM PROCEDURE TRAY) ×2 IMPLANT
PENCIL SMOKE EVACUATOR (MISCELLANEOUS) IMPLANT
SPONGE DRAIN TRACH 4X4 STRL 2S (GAUZE/BANDAGES/DRESSINGS) IMPLANT
SPONGE T-LAP 18X18 ~~LOC~~+RFID (SPONGE) IMPLANT
STRIP CLOSURE SKIN 1/2X4 (GAUZE/BANDAGES/DRESSINGS) IMPLANT
SUT ETHILON 2 0 PS N (SUTURE) IMPLANT
SUT MNCRL AB 4-0 PS2 18 (SUTURE) ×2 IMPLANT
SUT NOVA 0 T19/GS 22DT (SUTURE) ×2 IMPLANT
SUT NOVA NAB GS-21 0 18 T12 DT (SUTURE) IMPLANT
SUT PDS AB 0 CT1 36 (SUTURE) ×4 IMPLANT
SUT VIC AB 2-0 CT1 27 (SUTURE)
SUT VIC AB 2-0 CT1 TAPERPNT 27 (SUTURE) IMPLANT
SUT VIC AB 3-0 SH 27 (SUTURE) ×1
SUT VIC AB 3-0 SH 27XBRD (SUTURE) ×1 IMPLANT
SYR 20ML LL LF (SYRINGE) ×2 IMPLANT
TOWEL OR 17X26 10 PK STRL BLUE (TOWEL DISPOSABLE) ×2 IMPLANT
TOWEL OR NON WOVEN STRL DISP B (DISPOSABLE) ×2 IMPLANT

## 2021-08-05 NOTE — Op Note (Signed)
PATIENT:  Christopher Olson  55 y.o. male  PRE-OPERATIVE DIAGNOSIS:  INCISIONAL HERNIA  POST-OPERATIVE DIAGNOSIS:  INCISIONAL HERNIA  PROCEDURE:  Procedure(s): OPEN INCISIONAL HERNIA REPAIR WITH MESH   SURGEON:  Surgeon(s): Quinntin Malter, De Blanch, MD  ASSISTANT: none   ANESTHESIA:   local and general  Indications for procedure: [name] is a 55 y.o. year old male with symptoms of soreness and bulge in his upper abdomen. He had a history of colon resection and reversal.  Description of procedure: The patient was brought into the operative suite. Anesthesia was administered with General endotracheal anesthesia. WHO checklist was applied. The patient was then placed in supine. The area was prepped and draped in the usual sterile fashion.  A midline incision was made. Cautery was used to dissect through the subcutaneous tissue. The hernia was identified and mobilized from surrounding tissue. It was 5 cm superior to the umbilicus. The hernia sac and preperitoneum was dissected away from the rectus fascia and a preperitoneal space was created. The space was expanded in all directions. 2 peritoneal holes were repaired with 3-0 vicryl. The hernia defect was 4 cm in diameter. Due to the size of the hernia, a 15 x 15 cm Bard mesh was inserted into the preperitoneal space. Care was taken to lay the mesh flat. The mesh was sutured to the fascia in all directions with 0 novafil. The fascial defect was then primarily closed with interrupted 0 PDS sutures. The umbilical skin was sutured to the fascia with a 3-0 vicryl. The deep dermal space was closed with a 3-0 vicryl. Marcaine/Exparel mix was injected into the muscle layer and around the fascia. The skin was closed with a 4-0 monocryl subcuticular suture. Dermabond was put in place for dressing. The patient awoke from anesthesia and was brought to pacu in stable condition. All counts were correct.   Findings: 4 cm supraumbilical hernia  Type of repair -  mesh Name of mesh - Bard mesh Size of mesh - Length 15 cm, Width 15 cm Mesh overlap - 5 cm Placement of mesh - beneath fascia but external to peritoneal cavity  Specimen: none  Blood loss: 30 ml  Local anesthesia: 50 ml Marcaine/Exparel mix  Complications: none  PLAN OF CARE: Discharge to home after PACU  PATIENT DISPOSITION:  PACU - hemodynamically stable.  Feliciana Rossetti, M.D. General, Bariatric, & Minimally Invasive Surgery [facility], [place]  08/05/2021 9:11 AM

## 2021-08-05 NOTE — Anesthesia Procedure Notes (Signed)
Procedure Name: Intubation Date/Time: 08/05/2021 7:43 AM Performed by: Maxwell Caul, CRNA Pre-anesthesia Checklist: Patient identified, Emergency Drugs available, Suction available and Patient being monitored Patient Re-evaluated:Patient Re-evaluated prior to induction Oxygen Delivery Method: Circle system utilized Preoxygenation: Pre-oxygenation with 100% oxygen Induction Type: IV induction Ventilation: Mask ventilation without difficulty Laryngoscope Size: Mac and 4 Grade View: Grade I Tube type: Oral Tube size: 7.5 mm Number of attempts: 1 Airway Equipment and Method: Stylet Placement Confirmation: ETT inserted through vocal cords under direct vision, positive ETCO2 and breath sounds checked- equal and bilateral Secured at: 21 cm Tube secured with: Tape Dental Injury: Teeth and Oropharynx as per pre-operative assessment

## 2021-08-05 NOTE — Transfer of Care (Signed)
Immediate Anesthesia Transfer of Care Note  Patient: Christopher Olson  Procedure(s) Performed: OPEN INCISIONAL HERNIA REPAIR WITH MESH (Abdomen)  Patient Location: PACU  Anesthesia Type:General  Level of Consciousness: awake, alert  and oriented  Airway & Oxygen Therapy: Patient Spontanous Breathing and Patient connected to face mask oxygen  Post-op Assessment: Report given to RN and Post -op Vital signs reviewed and stable  Post vital signs: Reviewed and stable  Last Vitals:  Vitals Value Taken Time  BP 102/83 08/05/21 0917  Temp    Pulse 94 08/05/21 0918  Resp 15 08/05/21 0918  SpO2 100 % 08/05/21 0918  Vitals shown include unvalidated device data.  Last Pain:  Vitals:   08/05/21 0545  TempSrc: Oral  PainSc: 0-No pain      Patients Stated Pain Goal: 4 (08/05/21 0545)  Complications: No notable events documented.

## 2021-08-05 NOTE — Anesthesia Postprocedure Evaluation (Signed)
Anesthesia Post Note  Patient: Christopher Olson  Procedure(s) Performed: OPEN INCISIONAL HERNIA REPAIR WITH MESH (Abdomen)     Patient location during evaluation: PACU Anesthesia Type: General Level of consciousness: awake and alert Pain management: pain level controlled Vital Signs Assessment: post-procedure vital signs reviewed and stable Respiratory status: spontaneous breathing, nonlabored ventilation, respiratory function stable and patient connected to nasal cannula oxygen Cardiovascular status: blood pressure returned to baseline and stable Postop Assessment: no apparent nausea or vomiting Anesthetic complications: no   No notable events documented.  Last Vitals:  Vitals:   08/05/21 1015 08/05/21 1024  BP: (!) 144/98 123/72  Pulse: (!) 57 62  Resp: 13 14  Temp: 36.4 C 36.5 C  SpO2: 98% 94%    Last Pain:  Vitals:   08/05/21 1024  TempSrc:   PainSc: 3                  Kineta Fudala

## 2021-08-05 NOTE — H&P (Signed)
   Christopher Olson is an 55 y.o. male.  HPI: 55 yo male with history of colon resection presents for incisional hernia repair.  Past Medical History:  Diagnosis Date   Asthma    as a child   Diverticulitis    Hearing loss    Hypertension    Meniere disease    with hearing loss in rt ear   Pneumonia 02/2019   after hernia surgery    Past Surgical History:  Procedure Laterality Date   APPENDECTOMY     COLECTOMY  2000   for precancerous polyps   COLON RESECTION N/A 02/24/2019   Procedure: COLON RESECTION WITH COLOSTOMY;  Surgeon: Lativia Velie, De Blanch, MD;  Location: [facility] OR;  Service: General;  Laterality: N/A;   COLOSTOMY TAKEDOWN N/A 07/26/2019   Procedure: LAPAROSCOPIC COLOSTOMY REVERSAL WITH COLORECTAL ANASTOMOSIS;  Surgeon: [name] De Blanch, MD;  Location: [facility] ORS;  Service: General;  Laterality: N/A;   HERNIA REPAIR     LAPAROTOMY N/A 02/24/2019   Procedure: EXPLORATORY LAPAROTOMY;  Surgeon: Rodman Pickle, MD;  Location: [facility] OR;  Service: General;  Laterality: N/A;    Family History  Problem Relation Age of Onset   Irritable bowel syndrome Mother     Social History:  reports that he has never smoked. He has never used smokeless tobacco. He reports that he does not currently use alcohol after a past usage of about 2.0 standard drinks per week. He reports that he does not use drugs.  Allergies:  Allergies  Allergen Reactions   Shellfish-Derived Products Anaphylaxis   Lactose Intolerance (Gi) Diarrhea and Other (See Comments)    Bloating, also    Medications: I have reviewed the patient's current medications.  No results found for this or any previous visit (from the past 48 hour(s)).  No results found.  Review of Systems  Constitutional:  Negative for chills and fever.  HENT:  Negative for hearing loss.   Eyes:  Negative for blurred vision and double vision.  Respiratory:  Negative for cough and hemoptysis.   Cardiovascular:  Negative for chest pain and  palpitations.  Gastrointestinal:  Negative for abdominal pain, nausea and vomiting.  Genitourinary:  Negative for dysuria and urgency.  Musculoskeletal:  Negative for myalgias and neck pain.  Skin:  Negative for itching and rash.  Neurological:  Negative for dizziness, tingling and headaches.  Endo/Heme/Allergies:  Does not bruise/bleed easily.  Psychiatric/Behavioral:  Negative for depression and suicidal ideas.    PE Blood pressure 113/81, pulse 66, temperature 98.4 F (36.9 C), temperature source Oral, resp. rate 18, height 5\' 6"  (1.676 m), weight 85.3 kg, SpO2 100 %. Constitutional: NAD; conversant; no deformities Eyes: Moist conjunctiva; no lid lag; anicteric; PERRL Neck: Trachea midline; no thyromegaly Lungs: Normal respiratory effort; no tactile fremitus CV: RRR; no palpable thrills; no pitting edema GI: Abd supraumbilical hernia, reducible; no palpable hepatosplenomegaly MSK: Normal gait; no clubbing/cyanosis Psychiatric: Appropriate affect; alert and oriented x3 Lymphatic: No palpable cervical or axillary lymphadenopathy Skin: No major subcutaneous nodules. Warm and dry   Assessment/Plan: 55 yo male with incisional hernia -open incisional hernia repair with mesh as outpatient -ERAS protocol  44 Christopher Olson 08/05/2021, 7:34 AM

## 2021-08-05 NOTE — Discharge Instructions (Signed)
[facility] _______[facility]  UMBILICAL OR INGUINAL HERNIA REPAIR: POST OP INSTRUCTIONS  Always review your discharge instruction sheet given to you by the facility where your surgery was performed. IF YOU HAVE DISABILITY OR FAMILY LEAVE FORMS, YOU MUST BRING THEM TO THE OFFICE FOR PROCESSING.   DO NOT GIVE THEM TO YOUR DOCTOR.  1. A  prescription for pain medication may be given to you upon discharge.  Take your pain medication as prescribed, if needed.  If narcotic pain medicine is not needed, then you may take acetaminophen (Tylenol) or ibuprofen (Advil) as needed. 2. Take your usually prescribed medications unless otherwise directed. If you need a refill on your pain medication, please contact your pharmacy.  They will contact our office to request authorization. Prescriptions will not be filled after 5 pm or on week-ends. 3. You should follow a light diet the first 24 hours after arrival home, such as soup and crackers, etc.  Be sure to include lots of fluids daily.  Resume your normal diet the day after surgery. 4.Most patients will experience some swelling and bruising around the umbilicus or in the groin and scrotum.  Ice packs and reclining will help.  Swelling and bruising can take several days to resolve.  6. It is common to experience some constipation if taking pain medication after surgery.  Increasing fluid intake and taking a stool softener (such as Colace) will usually help or prevent this problem from occurring.  A mild laxative (Milk of Magnesia or Miralax) should be taken according to package directions if there are no bowel movements after 48 hours. 7. Unless discharge instructions indicate otherwise, you may remove your bandages 24-48 hours after surgery, and you may shower at that time.  You may have steri-strips (small skin tapes) in place directly over the incision.  These strips should be left on the skin for 7-10 days.  If your surgeon used skin glue on the  incision, you may shower in 24 hours.  The glue will flake off over the next 2-3 weeks.  Any sutures or staples will be removed at the office during your follow-up visit. 8. ACTIVITIES:  You may resume regular (light) daily activities beginning the next day--such as daily self-care, walking, climbing stairs--gradually increasing activities as tolerated.  You may have sexual intercourse when it is comfortable.  Refrain from any heavy lifting or straining until approved by your doctor.  a.You may drive when you are no longer taking prescription pain medication, you can comfortably wear a seatbelt, and you can safely maneuver your car and apply brakes. b.RETURN TO WORK:   _____________________________________________  9.You should see your doctor in the office for a follow-up appointment approximately 2-3 weeks after your surgery.  Make sure that you call for this appointment within a day or two after you arrive home to insure a convenient appointment time. 10.OTHER INSTRUCTIONS: _________________________    _____________________________________  WHEN TO CALL YOUR DOCTOR: Fever over 101.0 Inability to urinate Nausea and/or vomiting Extreme swelling or bruising Continued bleeding from incision. Increased pain, redness, or drainage from the incision  The clinic staff is available to answer your questions during regular business hours.  Please don't hesitate to call and ask to speak to one of the nurses for clinical concerns.  If you have a medical emergency, go to the nearest emergency room or call 911.  A surgeon from [facility] is always on call at the hospital   [address], [address],   [place], [place]  [address] ?  [address] [address], [place], [place]   [address] [phone] ? [phone] ? [phone] [phone] Web site: [email or web address]  

## 2021-08-06 ENCOUNTER — Encounter (HOSPITAL_COMMUNITY): Payer: Self-pay | Admitting: General Surgery

## 2022-07-04 ENCOUNTER — Other Ambulatory Visit: Payer: Self-pay | Admitting: Family Medicine

## 2022-07-04 ENCOUNTER — Other Ambulatory Visit (HOSPITAL_BASED_OUTPATIENT_CLINIC_OR_DEPARTMENT_OTHER): Payer: Self-pay | Admitting: Family Medicine

## 2022-07-04 DIAGNOSIS — E7800 Pure hypercholesterolemia, unspecified: Secondary | ICD-10-CM

## 2022-07-11 ENCOUNTER — Ambulatory Visit (HOSPITAL_COMMUNITY)
Admission: RE | Admit: 2022-07-11 | Discharge: 2022-07-11 | Disposition: A | Discharge status: Home / Self Care | Payer: BC Managed Care – PPO | Source: Ambulatory Visit | Attending: Family Medicine | Admitting: Family Medicine

## 2022-07-11 DIAGNOSIS — E7800 Pure hypercholesterolemia, unspecified: Secondary | ICD-10-CM | POA: Insufficient documentation

## 2024-04-01 ENCOUNTER — Other Ambulatory Visit (HOSPITAL_COMMUNITY): Payer: Self-pay

## 2024-04-01 MED ORDER — DESVENLAFAXINE SUCCINATE ER 50 MG PO TB24
50.0000 mg | ORAL_TABLET | Freq: Every day | ORAL | 5 refills | Status: AC
  Filled 2024-04-01 (×2): qty 30, 30d supply, fill #0
  Filled 2024-04-26: qty 30, 30d supply, fill #1
  Filled 2024-05-26: qty 30, 30d supply, fill #2
  Filled 2024-06-19: qty 30, 30d supply, fill #3
  Filled 2024-07-21: qty 30, 30d supply, fill #4

## 2024-04-15 ENCOUNTER — Other Ambulatory Visit (HOSPITAL_COMMUNITY): Payer: Self-pay

## 2024-04-15 MED ORDER — CHOLECALCIFEROL 1.25 MG (50000 UT) PO CAPS
50000.0000 [IU] | ORAL_CAPSULE | ORAL | 0 refills | Status: AC
  Filled 2024-04-15: qty 12, 84d supply, fill #0

## 2024-04-16 ENCOUNTER — Other Ambulatory Visit (HOSPITAL_COMMUNITY): Payer: Self-pay

## 2024-04-16 MED ORDER — TRAZODONE HCL 100 MG PO TABS
100.0000 mg | ORAL_TABLET | Freq: Every evening | ORAL | 5 refills | Status: AC
  Filled 2024-04-16: qty 30, 30d supply, fill #0
  Filled 2024-05-11: qty 30, 30d supply, fill #1
  Filled 2024-06-10: qty 30, 30d supply, fill #2
  Filled 2024-07-10: qty 30, 30d supply, fill #3
  Filled 2024-08-20: qty 30, 30d supply, fill #4

## 2024-04-16 MED ORDER — TRAZODONE HCL 100 MG PO TABS
100.0000 mg | ORAL_TABLET | Freq: Every evening | ORAL | 4 refills | Status: DC
  Filled 2024-04-16: qty 30, 30d supply, fill #0

## 2024-04-26 ENCOUNTER — Other Ambulatory Visit (HOSPITAL_COMMUNITY): Payer: Self-pay

## 2024-04-26 ENCOUNTER — Other Ambulatory Visit: Payer: Self-pay

## 2024-04-26 ENCOUNTER — Encounter: Payer: Self-pay | Admitting: Pharmacist

## 2024-07-23 ENCOUNTER — Other Ambulatory Visit (HOSPITAL_COMMUNITY): Payer: Self-pay

## 2024-07-23 ENCOUNTER — Other Ambulatory Visit: Payer: Self-pay

## 2024-07-23 MED ORDER — AMLODIPINE BESYLATE 5 MG PO TABS
5.0000 mg | ORAL_TABLET | Freq: Every day | ORAL | 3 refills | Status: AC
  Filled 2024-07-23: qty 90, 90d supply, fill #0
  Filled 2024-10-16: qty 90, 90d supply, fill #1
  Filled 2025-01-16: qty 90, 90d supply, fill #2

## 2024-07-23 MED ORDER — TRAZODONE HCL 100 MG PO TABS
100.0000 mg | ORAL_TABLET | Freq: Every day | ORAL | 3 refills | Status: AC
  Filled 2024-07-23 – 2024-08-06 (×2): qty 90, 90d supply, fill #0
  Filled 2024-10-30: qty 90, 90d supply, fill #1
  Filled 2025-01-28: qty 90, 90d supply, fill #2

## 2024-07-23 MED ORDER — DESVENLAFAXINE SUCCINATE ER 50 MG PO TB24
50.0000 mg | ORAL_TABLET | Freq: Every day | ORAL | 3 refills | Status: AC
  Filled 2024-07-23 – 2024-08-20 (×3): qty 90, 90d supply, fill #0
  Filled 2024-11-12: qty 90, 90d supply, fill #1

## 2024-08-06 ENCOUNTER — Other Ambulatory Visit (HOSPITAL_COMMUNITY): Payer: Self-pay

## 2024-08-06 ENCOUNTER — Other Ambulatory Visit: Payer: Self-pay

## 2024-08-20 ENCOUNTER — Other Ambulatory Visit (HOSPITAL_COMMUNITY): Payer: Self-pay

## 2024-08-21 ENCOUNTER — Other Ambulatory Visit (HOSPITAL_COMMUNITY): Payer: Self-pay

## 2024-08-21 ENCOUNTER — Other Ambulatory Visit: Payer: Self-pay

## 2024-10-16 ENCOUNTER — Other Ambulatory Visit: Payer: Self-pay

## 2024-10-30 ENCOUNTER — Other Ambulatory Visit (HOSPITAL_COMMUNITY): Payer: Self-pay

## 2024-11-13 ENCOUNTER — Other Ambulatory Visit (HOSPITAL_COMMUNITY): Payer: Self-pay

## 2025-01-16 ENCOUNTER — Other Ambulatory Visit (HOSPITAL_COMMUNITY): Payer: Self-pay

## 2025-01-17 ENCOUNTER — Other Ambulatory Visit: Payer: Self-pay

## 2025-01-28 ENCOUNTER — Other Ambulatory Visit: Payer: Self-pay
